# Patient Record
Sex: Male | Born: 1940 | Race: White | Hispanic: No | Marital: Married | Smoking: Current some day smoker
Health system: Southern US, Community
[De-identification: ages and names within clinical notes are randomized; demographics above are authoritative.]

## PROBLEM LIST (undated history)

## (undated) DIAGNOSIS — H833X9 Noise effects on inner ear, unspecified ear: Secondary | ICD-10-CM

## (undated) DIAGNOSIS — F0280 Dementia in other diseases classified elsewhere without behavioral disturbance: Secondary | ICD-10-CM

## (undated) DIAGNOSIS — K8020 Calculus of gallbladder without cholecystitis without obstruction: Secondary | ICD-10-CM

## (undated) DIAGNOSIS — F419 Anxiety disorder, unspecified: Secondary | ICD-10-CM

## (undated) DIAGNOSIS — K219 Gastro-esophageal reflux disease without esophagitis: Secondary | ICD-10-CM

## (undated) HISTORY — PX: APPENDECTOMY: SHX54

---
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
## 1999-10-08 ENCOUNTER — Ambulatory Visit (HOSPITAL_COMMUNITY): Admission: RE | Admit: 1999-10-08 | Discharge: 1999-10-08 | Payer: Self-pay | Admitting: Gastroenterology

## 2000-09-01 ENCOUNTER — Encounter: Payer: Self-pay | Admitting: Family Medicine

## 2000-09-01 ENCOUNTER — Encounter: Admission: RE | Admit: 2000-09-01 | Discharge: 2000-09-01 | Payer: Self-pay | Admitting: Family Medicine

## 2008-10-24 ENCOUNTER — Emergency Department (HOSPITAL_COMMUNITY): Admission: EM | Admit: 2008-10-24 | Discharge: 2008-10-24 | Payer: Self-pay | Admitting: Emergency Medicine

## 2008-12-20 ENCOUNTER — Ambulatory Visit: Payer: Self-pay | Admitting: Family Medicine

## 2009-11-20 ENCOUNTER — Encounter: Payer: Self-pay | Admitting: Family Medicine

## 2009-12-16 ENCOUNTER — Telehealth: Payer: Self-pay | Admitting: Family Medicine

## 2009-12-22 ENCOUNTER — Encounter: Payer: Self-pay | Admitting: Family Medicine

## 2010-01-06 ENCOUNTER — Ambulatory Visit: Payer: Self-pay | Admitting: Family Medicine

## 2010-01-06 DIAGNOSIS — K8020 Calculus of gallbladder without cholecystitis without obstruction: Secondary | ICD-10-CM | POA: Insufficient documentation

## 2010-01-06 HISTORY — DX: Calculus of gallbladder without cholecystitis without obstruction: K80.20

## 2010-01-06 LAB — CONVERTED CEMR LAB: PSA: 1.61 ng/mL (ref 0.10–4.00)

## 2010-01-29 ENCOUNTER — Encounter: Payer: Self-pay | Admitting: Family Medicine

## 2010-02-03 ENCOUNTER — Ambulatory Visit: Payer: Self-pay | Admitting: Family Medicine

## 2010-02-03 DIAGNOSIS — H833X9 Noise effects on inner ear, unspecified ear: Secondary | ICD-10-CM | POA: Insufficient documentation

## 2010-02-03 HISTORY — DX: Noise effects on inner ear, unspecified ear: H83.3X9

## 2010-03-23 ENCOUNTER — Encounter: Payer: Self-pay | Admitting: Family Medicine

## 2010-06-24 ENCOUNTER — Encounter: Payer: Self-pay | Admitting: Family Medicine
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
## 2010-07-14 NOTE — Procedures (Signed)
Summary: Colonoscopy Report/[facility]  Colonoscopy Report/[facility]   Imported By: Maryln Gottron 03/31/2010 15:43:27  _____________________________________________________________________  External Attachment:    Type:   Image     Comment:   External Document

## 2010-07-14 NOTE — Letter (Signed)
Summary: Multi-Ethnic Study of Atherosclerosis Results  Multi-Ethnic Study of Atherosclerosis Results   Imported By: Maryln Gottron 12/05/2009 12:44:07  _____________________________________________________________________  External Attachment:    Type:   Image     Comment:   External Document
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
## 2010-07-14 NOTE — Consult Note (Signed)
Summary: [facility]  [facility]   Imported By: Maryln Gottron 02/05/2010 12:42:28  _____________________________________________________________________  External Attachment:    Type:   Image     Comment:   External Document

## 2010-07-14 NOTE — Progress Notes (Signed)
Summary: Pt has questions re: getting complete physical done  Phone Note Call from Patient Call back at Work Phone [phone] x [phone] Call back at ext [phone]   Caller: Patient Summary of Call: Pt called and wanted to know what the next step would be in order to get a completed cpx, since info has been sent to Dr. Caryl Never from [facility].  Please advise.     Initial call taken by: Lucy Antigua,  December 16, 2009 3:43 PM  Follow-up for Phone Call        please clarify MESA? Follow-up by: Evelena Peat MD,  December 17, 2009 10:12 AM  Additional Follow-up for Phone Call Additional follow up Details #1::        MESA is Multi Ethnic Study of Atherosclerosis.  Additional Follow-up by: Lucy Antigua,  December 17, 2009 10:16 AM    Additional Follow-up for Phone Call Additional follow up Details #2::    reviewed labs from his study.  He had lipids and CBC. OK to schedule CPE and would obtain labs at office visit (have pt get AM appt fasting) WE will be sure not to duplicate labs just done. Follow-up by: Evelena Peat MD,  December 17, 2009 8:43 PM  Additional Follow-up for Phone Call Additional follow up Details #3:: Details for Additional Follow-up Action Taken: Called and lft vm for pt to call back and sch fasting labs, as noted above. Waiting for call back.   Additional Follow-up by: Lucy Antigua,  December 18, 2009 11:56 AM

## 2010-07-14 NOTE — Letter (Signed)
Summary: Multi-[facility] [place] [facility] Athersclerosis Report/[facility]  Multi-[facility] [place] of Athersclerosis Report/[facility]   Imported By: Maryln Gottron 01/12/2010 12:13:44  _____________________________________________________________________  External Attachment:    Type:   Image     Comment:   External Document

## 2010-07-22 NOTE — Letter (Signed)
Summary: [facility]   Imported By: Maryln Gottron 07/16/2010 15:06:48  _____________________________________________________________________  External Attachment:    Type:   Image     Comment:   External Document

## 2010-09-22 LAB — POCT I-STAT, CHEM 8
BUN: 16 mg/dL (ref 6–23)
Calcium, Ion: 1.17 mmol/L (ref 1.12–1.32)
Chloride: 106 mEq/L (ref 96–112)
Creatinine, Ser: 1.2 mg/dL (ref 0.4–1.5)
Glucose, Bld: 109 mg/dL — ABNORMAL HIGH (ref 70–99)
HCT: 40 % (ref 39.0–52.0)
Hemoglobin: 13.6 g/dL (ref 13.0–17.0)
Potassium: 4.2 mEq/L (ref 3.5–5.1)
Sodium: 142 mEq/L (ref 135–145)
TCO2: 26 mmol/L (ref 0–100)

## 2010-10-30 NOTE — Procedures (Signed)
. [facility]  Patient:    [name], Dennis Harper                          MRN: [id number] Proc. Date: 10/08/99 Adm. Date:  [id number] Attending:  Charna Elizabeth CC:         Nolon Nations, M.D., M.P.H.                           Procedure Report  DATE OF BIRTH:  February 22, 1941  REFERRING PHYSICIAN:  Nolon Nations, M.D., M.P.H.  PROCEDURE PERFORMED:  Colonoscopy.  ENDOSCOPIST:  Anselmo Rod, M.D.  INSTRUMENT USED:  Olympus video colonoscope.  INDICATIONS FOR PROCEDURE:  The patient is a 70 year old white male with rectal bleeding, rule out masses, polyps, hemorrhoids, etc.  PREPROCEDURE PREPARATION:  Informed consent was procured from the patient. The patient was fasted for eight hours prior to the procedure and prepped with a bottle of magnesium citrate and a gallon of NuLytely the night prior to the procedure.  PREPROCEDURE PHYSICAL:  The patient had stable vital signs.  Neck supple. Chest clear to auscultation.  S1, S2 regular.  Abdomen soft with normal abdominal bowel sounds.  DESCRIPTION OF PROCEDURE:  The patient was placed in the left lateral decubitus position and sedated with 50 mg of Demerol and 5 mg of Versed intravenously.  Once the patient was adequately sedated and maintained on low-flow oxygen and continuous cardiac monitoring, the Olympus video colonoscope was advanced from the rectum to the cecum with slight difficulty secondary to a large amount of residual stool in the colon.  There were small internal hemorrhoids seen on retroflexion.  No masses or polyps were seen. There was no evidence of diverticular disease.  The patient tolerated the procedure well without complication. The patient was changed from the left lateral decubitus position to the right lateral and supine position to facilitate adequate visualization of the colonic mucosa.  IMPRESSION: 1. Small internal hemorrhoids. 2. Large amount of residual stool in  the colon. 3. No masses or polyps seen.  Very small lesion may have been missed secondary    to residual stool in the colon.  RECOMMENDATIONS: 1. The patient has been advised to increase the fluid and fiber in his    diet. 2. Outpatient follow-up is advised in the next 10 days for further    recommendations.DD:  10/08/99 TD:  10/08/99 Job: [id number]

## 2011-06-16 DIAGNOSIS — R21 Rash and other nonspecific skin eruption: Secondary | ICD-10-CM | POA: Diagnosis not present

## 2011-06-16 DIAGNOSIS — L820 Inflamed seborrheic keratosis: Secondary | ICD-10-CM | POA: Diagnosis not present

## 2011-08-13 ENCOUNTER — Ambulatory Visit (INDEPENDENT_AMBULATORY_CARE_PROVIDER_SITE_OTHER): Payer: Medicare Other | Admitting: Family Medicine

## 2011-08-13 VITALS — BP 136/76 | HR 81 | Temp 98.6°F | Resp 16 | Ht 69.0 in | Wt 202.0 lb

## 2011-08-13 DIAGNOSIS — J069 Acute upper respiratory infection, unspecified: Secondary | ICD-10-CM

## 2011-08-13 DIAGNOSIS — R05 Cough: Secondary | ICD-10-CM

## 2011-08-13 DIAGNOSIS — R059 Cough, unspecified: Secondary | ICD-10-CM | POA: Diagnosis not present

## 2011-08-13 MED ORDER — HYDROCODONE-HOMATROPINE 5-1.5 MG/5ML PO SYRP: 5.0000 mL | ORAL_SOLUTION | Freq: Three times a day (TID) | ORAL | Status: AC | PRN

## 2011-08-13 NOTE — Progress Notes (Signed)
  Patient Name: Dennis Harper Date of Birth: [birth year] Medical Record Number: [id number] Gender: male Date of Encounter: 08/13/2011  History of Present Illness:  Dennis Harper is a 71 y.o. very pleasant male patient who presents with the following:  He has been traveling quite a bit lately. Got "a little bug" in "my lower esophagus."  Non- productive cough, keeps him up at night.  No fever or chills, no other URI symptoms.  Notes symptoms for the past 3 days.  He has tried some OTC medications but they have not helped much.   Patient Active Problem List  Diagnoses  . BENIGN POSITIONAL VERTIGO  . NOISE-INDUCED HEARING LOSS  . GALLSTONES  . PARESTHESIA   No past medical history on file. No past surgical history on file. History  Substance Use Topics  . Smoking status: Never Smoker   . Smokeless tobacco: Not on file  . Alcohol Use: No   No family history on file. No Known Allergies  Medication list has been reviewed and updated.  Review of Systems: As per HPI- otherwise negative.  Physical Examination: Filed Vitals:   08/13/11 0847  BP: 136/76  Pulse: 81  Temp: 98.6 F (37 C)  TempSrc: Oral  Resp: 16  Height: 5\' 9"  (1.753 m)  Weight: 202 lb (91.627 kg)    Body mass index is 29.83 kg/(m^2).  GEN: WDWN, NAD, Non-toxic, A & O x 3, looks well HEENT: Atraumatic, Normocephalic. Neck supple. No masses, No LAD.  Oropharynx wnl, TM wnl bilaterally Ears and Nose: No external deformity. CV: RRR, No M/G/R. No JVD. No thrill. No extra heart sounds. PULM: CTA B, no wheezes, crackles, rhonchi. No retractions. No resp. distress. No accessory muscle use. EXTR: No c/c/e NEURO Normal gait.  PSYCH: Normally interactive. Conversant. Not depressed or anxious appearing.  Calm demeanor.   Assessment and Plan: 1. Cough  HYDROcodone-homatropine (HYCODAN) 5-1.5 MG/5ML syrup   Likely viral URI- hycodan Rx as above to use for cough at night.  Also gave zpack rx to hold.   He can take the  zpack if he is still not better in a few days- let us know sooner if worse

## 2011-11-26 ENCOUNTER — Other Ambulatory Visit (HOSPITAL_COMMUNITY): Payer: Self-pay | Admitting: Radiology

## 2011-12-17 ENCOUNTER — Encounter: Payer: Self-pay | Admitting: Family Medicine

## 2012-01-05 DIAGNOSIS — R21 Rash and other nonspecific skin eruption: Secondary | ICD-10-CM | POA: Diagnosis not present

## 2012-01-07 ENCOUNTER — Encounter: Payer: Self-pay | Admitting: Family Medicine

## 2012-01-12 ENCOUNTER — Encounter: Payer: Self-pay | Admitting: Family Medicine

## 2012-01-12 ENCOUNTER — Ambulatory Visit (INDEPENDENT_AMBULATORY_CARE_PROVIDER_SITE_OTHER): Payer: Medicare Other | Admitting: Family Medicine

## 2012-01-12 VITALS — BP 130/80 | HR 72 | Temp 98.6°F | Resp 12 | Ht 70.25 in | Wt 205.0 lb

## 2012-01-12 DIAGNOSIS — Z0000 Encounter for general adult medical examination without abnormal findings: Secondary | ICD-10-CM | POA: Diagnosis not present

## 2012-01-12 DIAGNOSIS — K219 Gastro-esophageal reflux disease without esophagitis: Secondary | ICD-10-CM | POA: Diagnosis not present

## 2012-01-12 DIAGNOSIS — Z23 Encounter for immunization: Secondary | ICD-10-CM

## 2012-01-12 LAB — LIPID PANEL
Cholesterol: 190 mg/dL (ref 0–200)
HDL: 42.7 mg/dL (ref >39.00)
LDL Cholesterol: 114 mg/dL — ABNORMAL HIGH (ref 0–99)
Total CHOL/HDL Ratio: 4
Triglycerides: 167 mg/dL — ABNORMAL HIGH (ref 0.0–149.0)
VLDL: 33.4 mg/dL (ref 0.0–40.0)

## 2012-01-12 LAB — HEPATIC FUNCTION PANEL
ALT: 21 U/L (ref 0–53)
Alkaline Phosphatase: 41 U/L (ref 39–117)
Bilirubin, Direct: 0.1 mg/dL (ref 0.0–0.3)
Total Bilirubin: 0.7 mg/dL (ref 0.3–1.2)

## 2012-01-12 LAB — BASIC METABOLIC PANEL
BUN: 15 mg/dL (ref 6–23)
CO2: 29 mEq/L (ref 19–32)
Calcium: 8.9 mg/dL (ref 8.4–10.5)
Chloride: 104 mEq/L (ref 96–112)
Creatinine, Ser: 1.1 mg/dL (ref 0.4–1.5)
GFR: 67.02 mL/min (ref >60.00)
Glucose, Bld: 92 mg/dL (ref 70–99)
Potassium: 4.4 mEq/L (ref 3.5–5.1)
Sodium: 141 mEq/L (ref 135–145)

## 2012-01-12 LAB — CBC WITH DIFFERENTIAL/PLATELET
Basophils Relative: 0.5 % (ref 0.0–3.0)
Eosinophils Absolute: 0 10*3/uL (ref 0.0–0.7)
Hemoglobin: 13.7 g/dL (ref 13.0–17.0)
Lymphocytes Relative: 29.1 % (ref 12.0–46.0)
MCHC: 33.3 g/dL (ref 30.0–36.0)
Monocytes Relative: 7.4 % (ref 3.0–12.0)
Neutrophils Relative %: 62.2 % (ref 43.0–77.0)
RBC: 4.58 Mil/uL (ref 4.22–5.81)
WBC: 6 10*3/uL (ref 4.5–10.5)

## 2012-01-12 NOTE — Patient Instructions (Addendum)
Work on losing some weight.

## 2012-01-12 NOTE — Progress Notes (Signed)
Subjective:    Patient ID: Dennis Harper, male    DOB: 06-20-1940, 71 y.o.   MRN: [id number]  HPI  Patient seen for medical followup and Medicare wellness exam. He has history of chronic hearing loss with hearing aids. Has history of chronic GERD which is controlled with omeprazole. He has tried several times stopping this but has had breakthrough symptoms. No appetite changes. Some recent mild weight gain. No consistent exercise. No dysphagia.  Last tetanus over 10 years ago. No history of shingles vaccine. Colonoscopy 2011 which was normal.  Past Medical History  Diagnosis Date  . Migraine   . GALLSTONES 01/06/2010    Qualifier: Diagnosis of  By: Caryl Never MD, Bruce    . 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 Appendectomy     reports that he has never smoked. He does not have any smokeless tobacco history on file. He reports that he does not drink alcohol or use illicit drugs. family history is negative for Alcohol abuse and Heart disease. No Known Allergies  1.  Risk factors based on Past Medical , Social, and Family history reviewed as above 2.  Limitations in physical activities low risk for fall 3.  Depression/mood no recent depression or anxiety issues 4.  Hearing chronic hearing loss and wears bilateral hearing aids 5.  ADLs fully independent in all 6.  Cognitive function (orientation to time and place, language, writing, speech,memory) no issues with a shorter long-term every deficit. Language and judgment intact 7.  Home Safety no issues identified 8.  Height, weight, and visual acuity. Mild weight gain. Vision stable. 9.  Counseling discussed need for weight loss and more consistent exercise 10. Recommendation of preventive services. Needs tetanus booster and shingles vaccine. We'll check on coverage 11. Labs based on risk factors lipid, hepatic, BMP, CBC 12. Care Plan as  above.    Review of Systems  Constitutional: Negative for fever, activity change, appetite change, fatigue and unexpected weight change.  HENT: Negative for ear pain, congestion and trouble swallowing.   Eyes: Negative for pain and visual disturbance.  Respiratory: Negative for cough, shortness of breath and wheezing.   Cardiovascular: Negative for chest pain and palpitations.  Gastrointestinal: Negative for nausea, vomiting, abdominal pain, diarrhea, constipation, blood in stool, abdominal distention and rectal pain.  Genitourinary: Negative for dysuria, hematuria and testicular pain.  Musculoskeletal: Negative for joint swelling and arthralgias.  Skin: Negative for rash.  Neurological: Negative for dizziness, syncope and headaches.  Hematological: Negative for adenopathy.  Psychiatric/Behavioral: Negative for confusion and dysphoric mood.       Objective:   Physical Exam  Constitutional: He is oriented to person, place, and time. He appears well-developed and well-nourished. No distress.  HENT:  Head: Normocephalic and atraumatic.  Right Ear: External ear normal.  Left Ear: External ear normal.  Mouth/Throat: Oropharynx is clear and moist.  Eyes: Conjunctivae and EOM are normal. Pupils are equal, round, and reactive to light.  Neck: Normal range of motion. Neck supple. No thyromegaly present.  Cardiovascular: Normal rate, regular rhythm and normal heart sounds.   No murmur heard. Pulmonary/Chest: No respiratory distress. He has no wheezes. He has no rales.  Abdominal: Soft. Bowel sounds are normal. He exhibits no distension and no mass. There is no tenderness. There is no rebound and no guarding.  Musculoskeletal: He exhibits no edema.  Lymphadenopathy:  He has no cervical adenopathy.  Neurological: He is alert and oriented to person, place, and time. He displays normal reflexes. No cranial nerve deficit.  Skin: No rash noted.  Psychiatric: He has a normal mood and affect.            Assessment & Plan:  Patient seen for wellness visit. Tetanus booster be given. Check on coverage for shingles vaccine. Long history of GERD. Well controlled with omeprazole.

## 2012-01-13 NOTE — Progress Notes (Signed)
Quick Note:  Pt wife informed ______ 

## 2012-01-19 DIAGNOSIS — H40059 Ocular hypertension, unspecified eye: Secondary | ICD-10-CM | POA: Diagnosis not present

## 2012-01-19 DIAGNOSIS — H353190 Nonexudative age-related macular degeneration, unspecified eye, stage unspecified: Secondary | ICD-10-CM | POA: Diagnosis not present

## 2012-04-25 DIAGNOSIS — Z23 Encounter for immunization: Secondary | ICD-10-CM | POA: Diagnosis not present

## 2012-05-30 DIAGNOSIS — B356 Tinea cruris: Secondary | ICD-10-CM | POA: Diagnosis not present

## 2012-10-13 ENCOUNTER — Ambulatory Visit (INDEPENDENT_AMBULATORY_CARE_PROVIDER_SITE_OTHER): Payer: Medicare Other | Admitting: Emergency Medicine

## 2012-10-13 VITALS — BP 120/76 | HR 79 | Temp 98.0°F | Resp 16 | Ht 70.0 in | Wt 199.8 lb

## 2012-10-13 DIAGNOSIS — W57XXXA Bitten or stung by nonvenomous insect and other nonvenomous arthropods, initial encounter: Secondary | ICD-10-CM | POA: Diagnosis not present

## 2012-10-13 MED ORDER — DOXYCYCLINE HYCLATE 100 MG PO CAPS: 200.0000 mg | ORAL_CAPSULE | Freq: Once | ORAL | Status: DC

## 2012-10-13 NOTE — Patient Instructions (Addendum)
Deer Tick Bite Deer ticks are brown arachnids (spider family) that vary in size from as small as the head of a pin to 1/4 inch (1/2 cm) diameter. They thrive in wooded areas. Deer are the preferred host of adult deer ticks. Small rodents are the host of young ticks (nymphs). When a person walks in a field or wooded area, young and adult ticks in the surrounding grass and vegetation can attach themselves to the skin. They can suck blood for hours to days if unnoticed. Ticks are found all over the [place] Some ticks carry a specific bacteria (Borrelia burgdorferi) that causes an infection called Lyme disease. The bacteria is typically passed into a person during the blood sucking process. This happens after the tick has been attached for at least a number of hours. While ticks can be found all over the [place] those carrying the bacteria that causes Lyme disease are most common in [place] and the [place]. Only a small proportion of ticks in these areas carry the Lyme disease bacteria and cause human infections. Ticks usually attach to warm spots on the body, such as the:  Head.  Back.  Neck.  Armpits.  Groin. SYMPTOMS  Most of the time, a deer tick bite will not be felt. You may or may not see the attached tick. You may notice mild irritation or redness around the bite site. If the deer tick passes the Lyme disease bacteria to a person, a round, red rash may be noticed 2 to 3 days after the bite. The rash may be clear in the middle, like a bull's-eye or target. If not treated, other symptoms may develop several days to weeks after the onset of the rash. These symptoms may include:  New rash lesions.  Fatigue and weakness.  General ill feeling and achiness.  Chills.  Headache and neck pain.  Swollen lymph glands.  Sore muscles and joints. 5 to 15% of untreated people with Lyme disease may develop more severe illnesses after several weeks to months. This may include inflammation of the  brain lining (meningitis), nerve palsies, an abnormal heartbeat, or severe muscle and joint pain and inflammation (myositis or arthritis). DIAGNOSIS   Physical exam and medical history.  Viewing the tick if it was saved for confirmation.  Blood tests (to check or confirm the presence of Lyme disease). TREATMENT  Most ticks do not carry disease. If found, an attached tick should be removed using tweezers. Tweezers should be placed under the body of the tick so it is removed by its attachment parts (pincers). If there are signs or symptoms of being sick, or Lyme disease is confirmed, medicines (antibiotics) that kill germs are usually prescribed. In more severe cases, antibiotics may be given through an intravenous (IV) access. HOME CARE INSTRUCTIONS   Always remove ticks with tweezers. Do not use petroleum jelly or other methods to kill or remove the tick. Slide the tweezers under the body and pull out as much as you can. If you are not sure what it is, save it in a jar and show your caregiver.  Once you remove the tick, the skin will heal on its own. Wash your hands and the affected area with water and soap. You may place a bandage on the affected area.  Take medicine as directed. You may be advised to take a full course of antibiotics.  Follow up with your caregiver as recommended. FINDING OUT THE RESULTS OF YOUR TEST Not all test results are available   be advised to take a full course of antibiotics.   Follow up with your caregiver as recommended.  FINDING OUT THE RESULTS OF YOUR TEST  Not all test results are available during your visit. If your test results are not back during the visit, make an appointment with your caregiver to find out the results. Do not assume everything is normal if you have not heard from your caregiver or the medical facility. It is important for you to follow up on all of your test results.  PROGNOSIS   If Lyme disease is confirmed, early treatment with antibiotics is very effective. Following preventive guidelines is important since it is possible to get the disease more than once.  PREVENTION    Wear long sleeves and long pants in  wooded or grassy areas. Tuck your pants into your socks.   Use an insect repellent while hiking.   Check yourself, your children, and your pets regularly for ticks after playing outside.   Clear piles of leaves or brush from your yard. Ticks might live there.  SEEK MEDICAL CARE IF:    You or your child has an oral temperature above 102 F (38.9 C).   You develop a severe headache following the bite.   You feel generally ill.   You notice a rash.   You are having trouble removing the tick.   The bite area has red skin or yellow drainage.  SEEK IMMEDIATE MEDICAL CARE IF:    Your face is weak and droopy or you have other neurological symptoms.   You have severe joint pain or weakness.  MAKE SURE YOU:    Understand these instructions.   Will watch your condition.   Will get help right away if you are not doing well or get worse.  FOR MORE INFORMATION  Centers for Disease Control and Prevention: www.cdc.gov  American Academy of Family Physicians: www.aafp.org  Document Released: 08/25/2009 Document Revised: 08/23/2011 Document Reviewed: 08/25/2009  [employer] Patient Information 2013 [employer].

## 2012-10-13 NOTE — Progress Notes (Signed)
[facility] [address] [place] [address] [phone]  Date:  10/13/2012   Name:  Dennis Harper   DOB:  July 11, 1940   MRN:  [id number]  PCP:  Kristian Covey, MD    Chief Complaint: Tick Removal   History of Present Illness:  Dennis Harper is a 72 y.o. very pleasant male patient who presents with the following:  Found a tick on his right flank.  Removed by a friend.  Not symptomatic.  No improvement with over the counter medications or other home remedies. Denies other complaint or health concern today.   Patient Active Problem List   Diagnosis Date Noted  . GERD (gastroesophageal reflux disease) 01/12/2012  . 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 GALLSTONES 01/06/2010  . BENIGN POSITIONAL VERTIGO 12/20/2008  . PARESTHESIA 12/20/2008    Past Medical History  Diagnosis Date  . Migraine   . GALLSTONES 01/06/2010    Qualifier: Diagnosis of  By: Caryl Never MD, Bruce    . 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 Appendectomy      History  Substance Use Topics  . Smoking status: Former Games developer  . Smokeless tobacco: Not on file  . Alcohol Use: No    Family History  Problem Relation Age of Onset  . Alcohol abuse Neg Hx     family hx  . Heart disease Neg Hx     family hx    No Known Allergies  Medication list has been reviewed and updated.  Current Outpatient Prescriptions on File Prior to Visit  Medication Sig Dispense Refill  . omeprazole (PRILOSEC OTC) 20 MG tablet Take 20 mg by mouth daily.       No current facility-administered medications on file prior to visit.    Review of Systems:  As per HPI, otherwise negative.    Physical Examination: Filed Vitals:   10/13/12 0846  BP: 120/76  Pulse: 79  Temp: 98 F (36.7 C)  Resp: 16   Filed Vitals:   10/13/12 0846  Height: 5\' 10"  (1.778 m)  Weight: 199 lb 12.8 oz  (90.629 kg)   Body mass index is 28.67 kg/(m^2). Ideal Body Weight: Weight in (lb) to have BMI = 25: 173.9   GEN: WDWN, NAD, Non-toxic, Alert & Oriented x 3 HEENT: Atraumatic, Normocephalic.  Ears and Nose: No external deformity. EXTR: No clubbing/cyanosis/edema NEURO: Normal gait.  PSYCH: Normally interactive. Conversant. Not depressed or anxious appearing.  Calm demeanor.  Bite wound on right CVA  Assessment and Plan: Tick bite Doxy   Signed,  Phillips Odor, MD

## 2013-07-25 DIAGNOSIS — M5137 Other intervertebral disc degeneration, lumbosacral region: Secondary | ICD-10-CM | POA: Diagnosis not present

## 2013-07-25 DIAGNOSIS — M999 Biomechanical lesion, unspecified: Secondary | ICD-10-CM | POA: Diagnosis not present

## 2013-07-26 ENCOUNTER — Telehealth: Payer: Self-pay | Admitting: Family Medicine

## 2013-07-26 DIAGNOSIS — M5137 Other intervertebral disc degeneration, lumbosacral region: Secondary | ICD-10-CM | POA: Diagnosis not present

## 2013-07-26 DIAGNOSIS — M999 Biomechanical lesion, unspecified: Secondary | ICD-10-CM | POA: Diagnosis not present

## 2013-07-26 NOTE — Telephone Encounter (Signed)
Set up appointment °

## 2013-07-26 NOTE — Telephone Encounter (Signed)
Pt is not at home. Will try back later. No VM

## 2013-07-26 NOTE — Telephone Encounter (Signed)
Can you please call pt for appointment for tomorrow follow up. Thank you

## 2013-07-26 NOTE — Telephone Encounter (Signed)
Dr. Deatra Robinson calling to inform he saw this patient in his office today due to injury.  Pt states he also has experienced loss of urinary control as a result of the injury.  Dr. Deatra Robinson thinks the patient may need an MRI, however he has asked the patient to contact our office to schedule an appt with PCP.  Dr. Deatra Robinson will send over office visits note for PCP to review.

## 2013-07-27 NOTE — Telephone Encounter (Signed)
Pt request Mon appt, done!

## 2013-07-30 ENCOUNTER — Encounter: Payer: Self-pay | Admitting: Family Medicine

## 2013-07-30 ENCOUNTER — Ambulatory Visit (INDEPENDENT_AMBULATORY_CARE_PROVIDER_SITE_OTHER): Payer: Medicare Other | Admitting: Family Medicine

## 2013-07-30 VITALS — BP 132/72 | HR 74 | Temp 98.4°F | Wt 197.0 lb

## 2013-07-30 DIAGNOSIS — M545 Low back pain, unspecified: Secondary | ICD-10-CM | POA: Diagnosis not present

## 2013-07-30 NOTE — Progress Notes (Signed)
   Subjective:    Patient ID: Dennis Harper, male    DOB: 29-Nov-1940, 73 y.o.   MRN: [id number]  HPI  Seen with episode about 2 weeks ago of 2 episodes of urine or stool incontinence. Saw his chiropractor who ordered plain x-rays and suggested possible MRI scan. The patient at that time had low back pain. He denied any radiculopathy pain symptoms. No weakness. No numbness. No perianal anesthesia. Patient's had no episodes whatsoever since then. He has no low back pain at this time. He has not any previous back surgeries.  Past Medical History  Diagnosis Date  . Migraine   . GALLSTONES 01/06/2010    Qualifier: Diagnosis of  By: Elease Hashimoto MD, Jeorgia Helming    . 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 Appendectomy      reports that he has quit smoking. He does not have any smokeless tobacco history on file. He reports that he does not drink alcohol or use illicit drugs. family history is negative for Alcohol abuse and Heart disease. No Known Allergies    Review of Systems  Constitutional: Negative for fever, chills, appetite change and unexpected weight change.  Endocrine: Negative for polydipsia and polyuria.  Genitourinary: Negative for dysuria and hematuria.  Musculoskeletal: Negative for back pain.  Neurological: Negative for weakness and numbness.       Objective:   Physical Exam  Constitutional: He appears well-developed and well-nourished.  Cardiovascular: Normal rate.   Pulmonary/Chest: Effort normal and breath sounds normal. No respiratory distress. He has no wheezes. He has no rales.  Musculoskeletal:  Straight leg raises are negative bilaterally  Neurological:  Full-strength lower extremities. Symmetric reflexes. Normal sensory function to touch          Assessment & Plan:  Patient presents with recent ? transient urine and stool incontinence but no episodes over  the past couple weeks. He has nonfocal neurologic exam. At this point would recommend observation. MRI promptly if he has any recurrent episodes or new symptoms such as numbness or weakness

## 2013-07-30 NOTE — Patient Instructions (Signed)
Follow up promptly for any recurrent urine or stool incontinence. Also, follow up for any lower extremity numbness or weakness.

## 2013-07-30 NOTE — Progress Notes (Signed)
Pre visit review using our clinic review tool, if applicable. No additional management support is needed unless otherwise documented below in the visit note. 

## 2013-08-01 DIAGNOSIS — M5137 Other intervertebral disc degeneration, lumbosacral region: Secondary | ICD-10-CM | POA: Diagnosis not present

## 2013-08-01 DIAGNOSIS — M999 Biomechanical lesion, unspecified: Secondary | ICD-10-CM | POA: Diagnosis not present

## 2013-08-07 DIAGNOSIS — M5137 Other intervertebral disc degeneration, lumbosacral region: Secondary | ICD-10-CM | POA: Diagnosis not present

## 2013-08-07 DIAGNOSIS — M999 Biomechanical lesion, unspecified: Secondary | ICD-10-CM | POA: Diagnosis not present

## 2013-08-08 DIAGNOSIS — M999 Biomechanical lesion, unspecified: Secondary | ICD-10-CM | POA: Diagnosis not present

## 2013-08-08 DIAGNOSIS — M5137 Other intervertebral disc degeneration, lumbosacral region: Secondary | ICD-10-CM | POA: Diagnosis not present

## 2013-08-20 DIAGNOSIS — M5137 Other intervertebral disc degeneration, lumbosacral region: Secondary | ICD-10-CM | POA: Diagnosis not present

## 2013-08-20 DIAGNOSIS — M999 Biomechanical lesion, unspecified: Secondary | ICD-10-CM | POA: Diagnosis not present

## 2013-08-21 DIAGNOSIS — M5137 Other intervertebral disc degeneration, lumbosacral region: Secondary | ICD-10-CM | POA: Diagnosis not present

## 2013-08-21 DIAGNOSIS — M999 Biomechanical lesion, unspecified: Secondary | ICD-10-CM | POA: Diagnosis not present

## 2013-09-04 DIAGNOSIS — H353190 Nonexudative age-related macular degeneration, unspecified eye, stage unspecified: Secondary | ICD-10-CM | POA: Diagnosis not present

## 2013-10-10 DIAGNOSIS — M25519 Pain in unspecified shoulder: Secondary | ICD-10-CM | POA: Diagnosis not present

## 2013-10-30 DIAGNOSIS — M999 Biomechanical lesion, unspecified: Secondary | ICD-10-CM | POA: Diagnosis not present

## 2013-10-30 DIAGNOSIS — M5137 Other intervertebral disc degeneration, lumbosacral region: Secondary | ICD-10-CM | POA: Diagnosis not present

## 2013-11-06 DIAGNOSIS — M48061 Spinal stenosis, lumbar region without neurogenic claudication: Secondary | ICD-10-CM | POA: Diagnosis not present

## 2013-11-06 DIAGNOSIS — M79609 Pain in unspecified limb: Secondary | ICD-10-CM | POA: Diagnosis not present

## 2013-11-07 DIAGNOSIS — M545 Low back pain, unspecified: Secondary | ICD-10-CM | POA: Diagnosis not present

## 2013-11-23 DIAGNOSIS — M48061 Spinal stenosis, lumbar region without neurogenic claudication: Secondary | ICD-10-CM | POA: Diagnosis not present

## 2013-11-23 DIAGNOSIS — M5137 Other intervertebral disc degeneration, lumbosacral region: Secondary | ICD-10-CM | POA: Diagnosis not present

## 2013-11-23 DIAGNOSIS — M79609 Pain in unspecified limb: Secondary | ICD-10-CM | POA: Diagnosis not present

## 2013-11-26 ENCOUNTER — Encounter: Payer: Self-pay | Admitting: Family Medicine

## 2013-11-29 DIAGNOSIS — M48061 Spinal stenosis, lumbar region without neurogenic claudication: Secondary | ICD-10-CM | POA: Diagnosis not present

## 2013-12-06 DIAGNOSIS — M48061 Spinal stenosis, lumbar region without neurogenic claudication: Secondary | ICD-10-CM | POA: Diagnosis not present

## 2013-12-12 DIAGNOSIS — M545 Low back pain, unspecified: Secondary | ICD-10-CM | POA: Diagnosis not present

## 2014-05-02 DIAGNOSIS — H3530 Unspecified macular degeneration: Secondary | ICD-10-CM | POA: Diagnosis not present

## 2014-05-02 DIAGNOSIS — H40013 Open angle with borderline findings, low risk, bilateral: Secondary | ICD-10-CM | POA: Diagnosis not present

## 2014-08-12 DIAGNOSIS — L2084 Intrinsic (allergic) eczema: Secondary | ICD-10-CM | POA: Diagnosis not present

## 2014-08-12 DIAGNOSIS — B079 Viral wart, unspecified: Secondary | ICD-10-CM | POA: Diagnosis not present

## 2014-08-23 ENCOUNTER — Encounter: Payer: Self-pay | Admitting: Family Medicine

## 2014-08-23 ENCOUNTER — Ambulatory Visit (INDEPENDENT_AMBULATORY_CARE_PROVIDER_SITE_OTHER): Payer: Medicare Other | Admitting: Family Medicine

## 2014-08-23 VITALS — BP 120/70 | HR 78 | Temp 98.6°F | Wt 176.0 lb

## 2014-08-23 DIAGNOSIS — K4090 Unilateral inguinal hernia, without obstruction or gangrene, not specified as recurrent: Secondary | ICD-10-CM | POA: Diagnosis not present

## 2014-08-23 DIAGNOSIS — R21 Rash and other nonspecific skin eruption: Secondary | ICD-10-CM

## 2014-08-23 DIAGNOSIS — G3184 Mild cognitive impairment, so stated: Secondary | ICD-10-CM

## 2014-08-23 DIAGNOSIS — R413 Other amnesia: Secondary | ICD-10-CM

## 2014-08-23 LAB — TSH: TSH: 1.79 u[IU]/mL (ref 0.35–4.50)

## 2014-08-23 LAB — VITAMIN B12: Vitamin B-12: 833 pg/mL (ref 211–911)

## 2014-08-23 NOTE — Progress Notes (Signed)
   Subjective:    Patient ID: Dennis Harper, male    DOB: 08/11/1940, 74 y.o.   MRN: [id number]  HPI Patient seen today for the following concerns  Right inguinal swelling. He first noticed about 4 years ago but recently became more prominent. About 3 weeks ago he was straining to lift noticed some slight pain but none whatsoever since then. He has not seen any skin changes. He is not sure he wishes to pursue surgery at this point  Concern for possible short-term memory loss. His wife  recently noticed him repeating himself occasionally. He still works part-time. Generally can remember day of week and date without difficulty. He's not had any headaches. No focal neurologic symptoms.  Pruritic dry slightly scaly rash left medial leg. He has triamcinolone ointment which is helped somewhat. He has not been applying this until recently.  Past Medical History  Diagnosis Date  . Migraine   . GALLSTONES 01/06/2010    Qualifier: Diagnosis of  By: Elease Hashimoto MD, Bruce    . 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 Appendectomy      reports that he has quit smoking. He does not have any smokeless tobacco history on file. He reports that he does not drink alcohol or use illicit drugs. family history is negative for Alcohol abuse and Heart disease. No Known Allergies    Review of Systems  Constitutional: Negative for fever, chills, appetite change and unexpected weight change.  Respiratory: Negative for cough and shortness of breath.   Cardiovascular: Negative for chest pain.  Gastrointestinal: Negative for nausea, vomiting and abdominal pain.  Skin: Positive for rash.  Neurological: Negative for seizures and headaches.  Psychiatric/Behavioral: Negative for confusion, dysphoric mood and agitation.       Objective:   Physical Exam  Constitutional: He is oriented to person, place, and  time. He appears well-developed and well-nourished. No distress.  Neck: Neck supple. No thyromegaly present.  Cardiovascular: Normal rate and regular rhythm.   Pulmonary/Chest: Effort normal and breath sounds normal. No respiratory distress. He has no wheezes. He has no rales.  Genitourinary:  Large right inguinal hernia. Soft and nontender.  Musculoskeletal: He exhibits no edema.  Neurological: He is alert and oriented to person, place, and time. No cranial nerve deficit. Coordination normal.  Gait normal. No focal strength deficits.  Skin: Rash noted.  Left medial leg reveals nonspecific area of mild erythema and scaling approximately 3 x 4 cm. Nontender. No pustules. No vesicles.  Psychiatric:  Mini-Mental status exam 26 out of 30. No prior baseline for comparison          Assessment & Plan:  #1 large right inguinal hernia. Mostly asymptomatic. We discussed surgical referral and patient wishes to observe at this time. Follow-up immediately for any signs or symptoms of strangulation #2 eczematous rash left leg. Continue triamcinolone ointment as needed  #3 cognitive impairment. Check B12 and TSH. If normal, repeat MMSE in 6 months. We discussed possible medications such as Namenda or Aricept and at this point he wishes to wait

## 2014-08-23 NOTE — Patient Instructions (Addendum)
Inguinal Hernia, Adult Muscles help keep everything in the body in its proper place. But if a weak spot in the muscles develops, something can poke through. That is called a hernia. When this happens in the lower part of the belly (abdomen), it is called an inguinal hernia. (It takes its name from a part of the body in this region called the inguinal canal.) A weak spot in the wall of muscles lets some fat or part of the small intestine bulge through. An inguinal hernia can develop at any age. Men get them more often than women. CAUSES  In adults, an inguinal hernia develops over time.  It can be triggered by:  Suddenly straining the muscles of the lower abdomen.  Lifting heavy objects.  Straining to have a bowel movement. Difficult bowel movements (constipation) can lead to this.  Constant coughing. This may be caused by smoking or lung disease.  Being overweight.  Being pregnant.  Working at a job that requires long periods of standing or heavy lifting.  Having had an inguinal hernia before. One type can be an emergency situation. It is called a strangulated inguinal hernia. It develops if part of the small intestine slips through the weak spot and cannot get back into the abdomen. The blood supply can be cut off. If that happens, part of the intestine may die. This situation requires emergency surgery. SYMPTOMS  Often, a small inguinal hernia has no symptoms. It is found when a healthcare provider does a physical exam. Larger hernias usually have symptoms.   In adults, symptoms may include:  A lump in the groin. This is easier to see when the person is standing. It might disappear when lying down.  In men, a lump in the scrotum.  Pain or burning in the groin. This occurs especially when lifting, straining or coughing.  A dull ache or feeling of pressure in the groin.  Signs of a strangulated hernia can include:  A bulge in the groin that becomes very painful and tender to the  touch.  A bulge that turns red or purple.  Fever, nausea and vomiting.  Inability to have a bowel movement or to pass gas. DIAGNOSIS  To decide if you have an inguinal hernia, a healthcare provider will probably do a physical examination.  This will include asking questions about any symptoms you have noticed.  The healthcare provider might feel the groin area and ask you to cough. If an inguinal hernia is felt, the healthcare provider may try to slide it back into the abdomen.  Usually no other tests are needed. TREATMENT  Treatments can vary. The size of the hernia makes a difference. Options include:  Watchful waiting. This is often suggested if the hernia is small and you have had no symptoms.  No medical procedure will be done unless symptoms develop.  You will need to watch closely for symptoms. If any occur, contact your healthcare provider right away.  Surgery. This is used if the hernia is larger or you have symptoms.  Open surgery. This is usually an outpatient procedure (you will not stay overnight in a hospital). An cut (incision) is made through the skin in the groin. The hernia is put back inside the abdomen. The weak area in the muscles is then repaired by herniorrhaphy or hernioplasty. Herniorrhaphy: in this type of surgery, the weak muscles are sewn back together. Hernioplasty: a patch or mesh is used to close the weak area in the abdominal wall.  Laparoscopy.   In this procedure, a surgeon makes small incisions. A thin tube with a tiny video camera (called a laparoscope) is put into the abdomen. The surgeon repairs the hernia with mesh by looking with the video camera and using two long instruments. HOME CARE INSTRUCTIONS   After surgery to repair an inguinal hernia:  You will need to take pain medicine prescribed by your healthcare provider. Follow all directions carefully.  You will need to take care of the wound from the incision.  Your activity will be  restricted for awhile. This will probably include no heavy lifting for several weeks. You also should not do anything too active for a few weeks. When you can return to work will depend on the type of job that you have.  During "watchful waiting" periods, you should:  Maintain a healthy weight.  Eat a diet high in fiber (fruits, vegetables and whole grains).  Drink plenty of fluids to avoid constipation. This means drinking enough water and other liquids to keep your urine clear or pale yellow.  Do not lift heavy objects.  Do not stand for long periods of time.  Quit smoking. This should keep you from developing a frequent cough. SEEK MEDICAL CARE IF:   A bulge develops in your groin area.  You feel pain, a burning sensation or pressure in the groin. This might be worse if you are lifting or straining.  You develop a fever of more than 100.5 F (38.1 C). SEEK IMMEDIATE MEDICAL CARE IF:   Pain in the groin increases suddenly.  A bulge in the groin gets bigger suddenly and does not go down.  For men, there is sudden pain in the scrotum. Or, the size of the scrotum increases.  A bulge in the groin area becomes red or purple and is painful to touch.  You have nausea or vomiting that does not go away.  You feel your heart beating much faster than normal.  You cannot have a bowel movement or pass gas.  You develop a fever of more than 102.0 F (38.9 C). Document Released: 10/17/2008 Document Revised: 08/23/2011 Document Reviewed: 10/17/2008 [facility] Patient Information 2015 [place], [place]. This information is not intended to replace advice given to you by your health care provider. Make sure you discuss any questions you have with your health care provider. Mild Neurocognitive Disorder Mild neurocognitive disorder (formerly known as mild cognitive impairment) is a mental disorder. It is a slight abnormal decrease in mental function. The areas of mental function affected may  include memory, thought, communication, behavior, and completion of tasks. The decrease is noticeable and measurable but for the most part does not interfere with your daily activities. Mild neurocognitive disorder typically occurs in people older than 60 years but can occur earlier. It is not as serious as major neurocognitive disorder (formerly known as dementia) but may lead to a more serious neurocognitive disorder. However, in some cases the condition does not get worse. A few people with this disorder even improve. CAUSES  There are a number of different causes of mild neurocognitive disorder:   Brain disorders associated with abnormal protein deposits, such as Alzheimer's disease, Pick's disease, and Lewy body disease.  Brain disorders associated with abnormal movement, such as Parkinson's disease and Huntington's disease.  Diseases affecting blood vessels in the brain and resulting in mini-strokes.  Certain infections, such as human immunodeficiency virus (HIV) infection.  Traumatic brain injury.  Other medical conditions such as brain tumors, underactive thyroid (hypothyroidism), and vitamin  B12 deficiency.  Use of certain prescription medicine and "recreational" drugs. SYMPTOMS  Symptoms of mild neurocognitive disorder include:  Difficulty remembering. You may forget details of recent events, names, or phone numbers. You may forget important social events and appointments or repeatedly forget where you put your car keys.  Difficulty thinking and solving problems. You may have trouble with complex tasks such as paying bills or driving in unfamiliar locations.  Difficulty communicating. You may have trouble finding the right word, naming an object, forming a sentence that makes sense, or understanding what you read or hear.  Changes in your behavior or personality. You may lose interest in the things that you used to enjoy or withdraw from social situations. You may get angry  more easily than usual. You may act before thinking. You may do things in public that you would not usually do. You may hear or see things that are not real (hallucinations). You may believe falsely that others are trying to hurt you (paranoia). DIAGNOSIS Mild neurocognitive disorder is diagnosed through an assessment by your health care provider. Your health care provider will ask you and your family, friends, or coworkers questions about your symptoms. He or she will ask how often the symptoms occur, how long they have been occurring, whether they are getting worse, and the effect they are having on your life. Your health care provider may refer you to a neurologist or mental health specialist for a detailed evaluation of your mental functions (neuropsychological testing).  To identify the cause of your mild neurocognitive disorder, your health care provider may:  Obtain a detailed medical history.  Ask about alcohol and drug use, including prescription medicine.  Perform a physical exam.  Order blood tests and brain imaging exams. TREATMENT  Mild neurocognitive disorder caused by infections, use of certain medicines or "recreational" drugs, and certain medical conditions may improve with treatment of the condition that is causing the disorder. Mild neurocognitive disorder resulting from other causes generally does not improve and may worsen. In these cases, the goal of treatment is to slow progression of the disorder and help you cope with the loss of mental function. Treatments in these cases include:   Medicine. Medicine helps mainly with memory loss and behavioral symptoms.   Talk therapy. Talk therapy provides education, emotional support, memory aids, and other ways of making up for decreases in mental function.   Lifestyle changes. These include regular exercise, a healthy diet (including essential omega-3 fatty acids), intellectual stimulation, and increased social  interaction. Document Released: 01/31/2013 Document Revised: 10/15/2013 Document Reviewed: 01/31/2013 [facility] Patient Information 2015 [place], [place]. This information is not intended to replace advice given to you by your health care provider. Make sure you discuss any questions you have with your health care provider.

## 2014-08-23 NOTE — Progress Notes (Signed)
Pre visit review using our clinic review tool, if applicable. No additional management support is needed unless otherwise documented below in the visit note. 

## 2014-08-26 ENCOUNTER — Telehealth: Payer: Self-pay

## 2014-08-26 NOTE — Telephone Encounter (Signed)
Spoke to patient's wife about neuro referral concerns. Patient's wife states she would like a referral. Made patient's wife aware of MD's notes of normal labs and plan to follow-up 6 months later. Patient's wife continued to state concerns. Advised we will revisit this topic with MD Burchette and follow back up with patient and wife tomorrow.

## 2014-08-26 NOTE — Telephone Encounter (Signed)
I suggest that we not wait 6 months to reassess and have pt AND his wife (if pt is willing) return for discussion of further interventions in next 1-2 months.

## 2014-08-27 ENCOUNTER — Telehealth: Payer: Self-pay

## 2014-08-27 NOTE — Telephone Encounter (Signed)
Lm on vm for pt.to cb °

## 2014-08-27 NOTE — Telephone Encounter (Signed)
Called to give patient his lab results. Pt wife picked up the phone when I asked to speak to Dennis Harper pt wife stated that no one by that name leaves here and that the patient goes by Dennis Harper. Pt wife also asked me what did I want with her husband. Informed the wife that i was calling from Dr. Elease Hashimoto office to give the patient his results. Pt wife then asked for me to give her the results, I checked to see if patient wife was on the [facility] and she is listed on his DPR. Gave patient wife the results. Pt wife then insisted that I put in a referral for the patient to go see a neurologist, I informed patient wife that i would have to get approval from Dr. Elease Hashimoto before i could put in a referral. Pt wife said that she would like to speak to Dr. Elease Hashimoto, i informed the wife that Dr. B was in a room with a patient, pt wife then says you need to get him out of that room with the patient. I informed the patient wife that i could not do that because he was in a room with a patient, and that i would give Dr. Elease Hashimoto the message and have him give her a call when he gets out of the room. Pt wife stated that was not good enough. I told the pt wife to hold on and i went and got [place].

## 2014-08-27 NOTE — Telephone Encounter (Signed)
Can you please call patient and schedule a visit.

## 2014-08-29 ENCOUNTER — Telehealth: Payer: Self-pay | Admitting: Family Medicine

## 2014-08-29 NOTE — Telephone Encounter (Signed)
Noted  

## 2014-08-29 NOTE — Telephone Encounter (Signed)
Can please schedule appt.

## 2014-08-29 NOTE — Telephone Encounter (Signed)
Pt would like a referral to a neurologist for his short term memory loss.

## 2014-08-29 NOTE — Telephone Encounter (Signed)
Wife states pt will not be available until after 2 pm Friday and she cannot schedule his appts.  Will cb then to sche pt.

## 2014-09-02 NOTE — Telephone Encounter (Signed)
appt has been scheduled for 09/16/14

## 2014-09-03 DIAGNOSIS — H3530 Unspecified macular degeneration: Secondary | ICD-10-CM | POA: Diagnosis not present

## 2014-09-10 NOTE — Telephone Encounter (Signed)
Pt has been scheduled.  °

## 2014-09-16 ENCOUNTER — Ambulatory Visit (INDEPENDENT_AMBULATORY_CARE_PROVIDER_SITE_OTHER): Payer: Medicare Other | Admitting: Family Medicine

## 2014-09-16 ENCOUNTER — Encounter: Payer: Self-pay | Admitting: Family Medicine

## 2014-09-16 VITALS — BP 120/68 | HR 76 | Temp 98.4°F | Wt 176.0 lb

## 2014-09-16 DIAGNOSIS — G309 Alzheimer's disease, unspecified: Secondary | ICD-10-CM | POA: Insufficient documentation

## 2014-09-16 DIAGNOSIS — F0280 Dementia in other diseases classified elsewhere without behavioral disturbance: Secondary | ICD-10-CM | POA: Insufficient documentation

## 2014-09-16 DIAGNOSIS — R4189 Other symptoms and signs involving cognitive functions and awareness: Secondary | ICD-10-CM | POA: Diagnosis not present

## 2014-09-16 MED ORDER — MEMANTINE HCL 28 X 5 MG & 21 X 10 MG PO TABS: ORAL_TABLET | ORAL | Status: DC

## 2014-09-16 NOTE — Patient Instructions (Signed)
Mild Neurocognitive Disorder Mild neurocognitive disorder (formerly known as mild cognitive impairment) is a mental disorder. It is a slight abnormal decrease in mental function. The areas of mental function affected may include memory, thought, communication, behavior, and completion of tasks. The decrease is noticeable and measurable but for the most part does not interfere with your daily activities. Mild neurocognitive disorder typically occurs in people older than 60 years but can occur earlier. It is not as serious as major neurocognitive disorder (formerly known as dementia) but may lead to a more serious neurocognitive disorder. However, in some cases the condition does not get worse. A few people with this disorder even improve. CAUSES  There are a number of different causes of mild neurocognitive disorder:   Brain disorders associated with abnormal protein deposits, such as Alzheimer's disease, Pick's disease, and Lewy body disease.  Brain disorders associated with abnormal movement, such as Parkinson's disease and Huntington's disease.  Diseases affecting blood vessels in the brain and resulting in mini-strokes.  Certain infections, such as human immunodeficiency virus (HIV) infection.  Traumatic brain injury.  Other medical conditions such as brain tumors, underactive thyroid (hypothyroidism), and vitamin B12 deficiency.  Use of certain prescription medicine and "recreational" drugs. SYMPTOMS  Symptoms of mild neurocognitive disorder include:  Difficulty remembering. You may forget details of recent events, names, or phone numbers. You may forget important social events and appointments or repeatedly forget where you put your car keys.  Difficulty thinking and solving problems. You may have trouble with complex tasks such as paying bills or driving in unfamiliar locations.  Difficulty communicating. You may have trouble finding the right word, naming an object, forming a  sentence that makes sense, or understanding what you read or hear.  Changes in your behavior or personality. You may lose interest in the things that you used to enjoy or withdraw from social situations. You may get angry more easily than usual. You may act before thinking. You may do things in public that you would not usually do. You may hear or see things that are not real (hallucinations). You may believe falsely that others are trying to hurt you (paranoia). DIAGNOSIS Mild neurocognitive disorder is diagnosed through an assessment by your health care provider. Your health care provider will ask you and your family, friends, or coworkers questions about your symptoms. He or she will ask how often the symptoms occur, how long they have been occurring, whether they are getting worse, and the effect they are having on your life. Your health care provider may refer you to a neurologist or mental health specialist for a detailed evaluation of your mental functions (neuropsychological testing).  To identify the cause of your mild neurocognitive disorder, your health care provider may:  Obtain a detailed medical history.  Ask about alcohol and drug use, including prescription medicine.  Perform a physical exam.  Order blood tests and brain imaging exams. TREATMENT  Mild neurocognitive disorder caused by infections, use of certain medicines or "recreational" drugs, and certain medical conditions may improve with treatment of the condition that is causing the disorder. Mild neurocognitive disorder resulting from other causes generally does not improve and may worsen. In these cases, the goal of treatment is to slow progression of the disorder and help you cope with the loss of mental function. Treatments in these cases include:   Medicine. Medicine helps mainly with memory loss and behavioral symptoms.   Talk therapy. Talk therapy provides education, emotional support, memory aids, and other   include:   · Medicine. Medicine helps mainly with memory loss and behavioral symptoms.    · Talk therapy. Talk therapy provides education, emotional support, memory aids, and other ways of  making up for decreases in mental function.       · Lifestyle changes. These include regular exercise, a healthy diet (including essential omega-3 fatty acids), intellectual stimulation, and increased social interaction.  Document Released: 01/31/2013 Document Revised: 10/15/2013 Document Reviewed: 01/31/2013  [employer]® Patient Information ©2015 [employer]. This information is not intended to replace advice given to you by your health care provider. Make sure you discuss any questions you have with your health care provider.

## 2014-09-16 NOTE — Progress Notes (Signed)
   Subjective:    Patient ID: Dennis Harper, male    DOB: 03/15/1941, 74 y.o.   MRN: [id number]  HPI Follow-up regarding cognitive impairment. Refer to prior note. Patient had noted that his wife commented that he was frequently repeating himself. He does not think this is much problem yet. He scored 26 out of 30 on Mini-Mental status exam. B12 and TSH were normal. We have discussed possible initiation of medication such as Aricept or Namenda and he initially declined. He is willing to reconsider this time. We also discussed possible referral to neurologist but is not sure he wishes to proceed with that at this time. No recent headaches.  Practically, he has noticed problems with things like driving and sometimes finding difficulty with directions. Frequently misplaces things.  Past Medical History  Diagnosis Date  . Migraine   . GALLSTONES 01/06/2010    Qualifier: Diagnosis of  By: Elease Hashimoto MD, Andrell Tallman    . 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 Appendectomy      reports that he has quit smoking. He does not have any smokeless tobacco history on file. He reports that he does not drink alcohol or use illicit drugs. family history is negative for Alcohol abuse and Heart disease. No Known Allergies    Review of Systems  Constitutional: Negative for appetite change and unexpected weight change.  Neurological: Negative for dizziness and headaches.  Psychiatric/Behavioral: Negative for dysphoric mood.       Objective:   Physical Exam  Constitutional: He appears well-developed and well-nourished. No distress.  Cardiovascular: Normal rate and regular rhythm.   Pulmonary/Chest: Effort normal and breath sounds normal. No respiratory distress. He has no wheezes. He has no rales.  Neurological: He is alert. No cranial nerve deficit.          Assessment & Plan:  Cognitive  impairment with recent MMSE 26 out of 30. Patient wishes to reconsider medication options. We recommended Namenda starter pack and reassess in one month and hopefully his wife can accompany him then.

## 2014-09-16 NOTE — Progress Notes (Signed)
Pre visit review using our clinic review tool, if applicable. No additional management support is needed unless otherwise documented below in the visit note. 

## 2014-09-18 ENCOUNTER — Telehealth: Payer: Self-pay | Admitting: Family Medicine

## 2014-09-18 NOTE — Telephone Encounter (Signed)
[facility]

## 2014-09-18 NOTE — Telephone Encounter (Signed)
See if other pharmacies have this.

## 2014-09-18 NOTE — Telephone Encounter (Signed)
[facility] can not get the namenda it on  manufacture backorder. Please advise

## 2014-09-18 NOTE — Telephone Encounter (Signed)
Pt wife said she call [facility] and they are out. Pt call pt wife back

## 2014-09-18 NOTE — Telephone Encounter (Signed)
Lets take Namenda off list and start Aricept 5 mg once daily and reassess one month.

## 2014-09-19 MED ORDER — DONEPEZIL HCL 5 MG PO TABS: 5.0000 mg | ORAL_TABLET | Freq: Every day | ORAL | Status: DC

## 2014-09-19 NOTE — Telephone Encounter (Signed)
Rx sent to pharmacy. Pt is informed.

## 2014-10-16 ENCOUNTER — Encounter: Payer: Self-pay | Admitting: Family Medicine

## 2014-10-16 ENCOUNTER — Ambulatory Visit (INDEPENDENT_AMBULATORY_CARE_PROVIDER_SITE_OTHER): Payer: Medicare Other | Admitting: Family Medicine

## 2014-10-16 VITALS — BP 126/80 | HR 74 | Temp 98.4°F | Wt 175.0 lb

## 2014-10-16 DIAGNOSIS — R4189 Other symptoms and signs involving cognitive functions and awareness: Secondary | ICD-10-CM

## 2014-10-16 DIAGNOSIS — K4090 Unilateral inguinal hernia, without obstruction or gangrene, not specified as recurrent: Secondary | ICD-10-CM

## 2014-10-16 NOTE — Progress Notes (Signed)
Pre visit review using our clinic review tool, if applicable. No additional management support is needed unless otherwise documented below in the visit note. 

## 2014-10-16 NOTE — Progress Notes (Signed)
   Subjective:    Patient ID: Dennis Harper, male    DOB: January 10, 1941, 74 y.o.   MRN: [id number]  HPI Patient seen for the following  Cognitive impairment. Recent MMSE 26 out of 30. We recommended Namenda but apparently there was some national back order. We then tried Aricept 5 mg daily but patient had intolerance. He states this made him feel "weird". He stopped taking this after a few days. He is reluctant to take any medications at this time. He does not feel his memory post major problem. Recent B12 and thyroid normal. No headaches. No focal neurologic symptoms.  Right inguinal hernia. Has been noted previously. Nonpainful. Slowly growing in size. He is requesting referral to general surgeon this time  Past Medical History  Diagnosis Date  . Migraine   . GALLSTONES 01/06/2010    Qualifier: Diagnosis of  By: Elease Hashimoto MD, Willam Munford    . 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 Appendectomy      reports that he has quit smoking. He does not have any smokeless tobacco history on file. He reports that he does not drink alcohol or use illicit drugs. family history is negative for Alcohol abuse and Heart disease. No Known Allergies    Review of Systems  Constitutional: Negative for fatigue.  Eyes: Negative for visual disturbance.  Respiratory: Negative for cough, chest tightness and shortness of breath.   Cardiovascular: Negative for chest pain, palpitations and leg swelling.  Gastrointestinal: Negative for nausea, vomiting and abdominal pain.  Neurological: Negative for dizziness, syncope, weakness, light-headedness and headaches.       Objective:   Physical Exam  Constitutional: He is oriented to person, place, and time. He appears well-developed and well-nourished.  HENT:  Right Ear: External ear normal.  Left Ear: External ear normal.  Mouth/Throat: Oropharynx is clear and  moist.  Eyes: Pupils are equal, round, and reactive to light.  Neck: Neck supple. No thyromegaly present.  Cardiovascular: Normal rate and regular rhythm.   Pulmonary/Chest: Effort normal and breath sounds normal. No respiratory distress. He has no wheezes. He has no rales.  Abdominal: Soft. There is no tenderness.  Genitourinary:  Large right inguinal hernia. Soft and nontender.  Musculoskeletal: He exhibits no edema.  Neurological: He is alert and oriented to person, place, and time.          Assessment & Plan:  #1 cognitive impairment. Patient reluctant to try other medications. Intolerance with Aricept. Reassess 4 months and repeat MMSE then. Consider trial of Namenda that point if he is willing #2 right inguinal hernia. Set up to see general surgeon per patient request

## 2014-11-01 ENCOUNTER — Other Ambulatory Visit: Payer: Self-pay | Admitting: Surgery

## 2014-11-01 DIAGNOSIS — K4090 Unilateral inguinal hernia, without obstruction or gangrene, not specified as recurrent: Secondary | ICD-10-CM | POA: Diagnosis not present

## 2015-02-24 ENCOUNTER — Ambulatory Visit (INDEPENDENT_AMBULATORY_CARE_PROVIDER_SITE_OTHER): Payer: Medicare Other | Admitting: Family Medicine

## 2015-02-24 ENCOUNTER — Encounter: Payer: Self-pay | Admitting: Family Medicine

## 2015-02-24 VITALS — BP 120/80 | HR 76 | Temp 98.1°F | Wt 169.0 lb

## 2015-02-24 DIAGNOSIS — R4189 Other symptoms and signs involving cognitive functions and awareness: Secondary | ICD-10-CM

## 2015-02-24 MED ORDER — MEMANTINE HCL 28 X 5 MG & 21 X 10 MG PO TABS: ORAL_TABLET | ORAL | Status: DC

## 2015-02-24 MED ORDER — MEMANTINE HCL 10 MG PO TABS: 10.0000 mg | ORAL_TABLET | Freq: Two times a day (BID) | ORAL | Status: DC

## 2015-02-24 NOTE — Progress Notes (Signed)
Pre visit review using our clinic review tool, if applicable. No additional management support is needed unless otherwise documented below in the visit note. 

## 2015-02-24 NOTE — Patient Instructions (Signed)
Mild Neurocognitive Disorder Mild neurocognitive disorder (formerly known as mild cognitive impairment) is a mental disorder. It is a slight abnormal decrease in mental function. The areas of mental function affected may include memory, thought, communication, behavior, and completion of tasks. The decrease is noticeable and measurable but for the most part does not interfere with your daily activities. Mild neurocognitive disorder typically occurs in people older than 60 years but can occur earlier. It is not as serious as major neurocognitive disorder (formerly known as dementia) but may lead to a more serious neurocognitive disorder. However, in some cases the condition does not get worse. A few people with this disorder even improve. CAUSES  There are a number of different causes of mild neurocognitive disorder:   Brain disorders associated with abnormal protein deposits, such as Alzheimer's disease, Pick's disease, and Lewy body disease.  Brain disorders associated with abnormal movement, such as Parkinson's disease and Huntington's disease.  Diseases affecting blood vessels in the brain and resulting in mini-strokes.  Certain infections, such as human immunodeficiency virus (HIV) infection.  Traumatic brain injury.  Other medical conditions such as brain tumors, underactive thyroid (hypothyroidism), and vitamin B12 deficiency.  Use of certain prescription medicine and "recreational" drugs. SYMPTOMS  Symptoms of mild neurocognitive disorder include:  Difficulty remembering. You may forget details of recent events, names, or phone numbers. You may forget important social events and appointments or repeatedly forget where you put your car keys.  Difficulty thinking and solving problems. You may have trouble with complex tasks such as paying bills or driving in unfamiliar locations.  Difficulty communicating. You may have trouble finding the right word, naming an object, forming a  sentence that makes sense, or understanding what you read or hear.  Changes in your behavior or personality. You may lose interest in the things that you used to enjoy or withdraw from social situations. You may get angry more easily than usual. You may act before thinking. You may do things in public that you would not usually do. You may hear or see things that are not real (hallucinations). You may believe falsely that others are trying to hurt you (paranoia). DIAGNOSIS Mild neurocognitive disorder is diagnosed through an assessment by your health care provider. Your health care provider will ask you and your family, friends, or coworkers questions about your symptoms. He or she will ask how often the symptoms occur, how long they have been occurring, whether they are getting worse, and the effect they are having on your life. Your health care provider may refer you to a neurologist or mental health specialist for a detailed evaluation of your mental functions (neuropsychological testing).  To identify the cause of your mild neurocognitive disorder, your health care provider may:  Obtain a detailed medical history.  Ask about alcohol and drug use, including prescription medicine.  Perform a physical exam.  Order blood tests and brain imaging exams. TREATMENT  Mild neurocognitive disorder caused by infections, use of certain medicines or "recreational" drugs, and certain medical conditions may improve with treatment of the condition that is causing the disorder. Mild neurocognitive disorder resulting from other causes generally does not improve and may worsen. In these cases, the goal of treatment is to slow progression of the disorder and help you cope with the loss of mental function. Treatments in these cases include:   Medicine. Medicine helps mainly with memory loss and behavioral symptoms.   Talk therapy. Talk therapy provides education, emotional support, memory aids, and other   ways of  making up for decreases in mental function.   Lifestyle changes. These include regular exercise, a healthy diet (including essential omega-3 fatty acids), intellectual stimulation, and increased social interaction. Document Released: 01/31/2013 Document Revised: 10/15/2013 Document Reviewed: 01/31/2013 [facility] Patient Information 2015 [place], [place]. This information is not intended to replace advice given to you by your health care provider. Make sure you discuss any questions you have with your health care provider.  Start the Namenda titration pack first and this will last for one month Then, continue with the Namenda 10 mg twice daily

## 2015-02-24 NOTE — Progress Notes (Signed)
   Subjective:    Patient ID: Dennis Harper, male    DOB: 05/30/1941, 74 y.o.   MRN: [id number]  HPI Patient seen for follow-up regarding cognitive impairment. Refer to prior notes. MMSE 26 out of 30 last spring. Normal B12 and normal TSH. We initially started Namenda but there apparently was a Psychologist, prison and probation services and he states he could not get medication. Subsequently we started Aricept but he states he had severe insomnia. He is now here for follow-up He continues to work part-time. He did apparently see surgeon regarding his anal hernia. No complaints at this time.  Past Medical History  Diagnosis Date  . Migraine   . GALLSTONES 01/06/2010    Qualifier: Diagnosis of  By: Elease Hashimoto MD, Jackqulyn Mendel    . 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 Appendectomy      reports that he has quit smoking. He does not have any smokeless tobacco history on file. He reports that he does not drink alcohol or use illicit drugs. family history is negative for Alcohol abuse and Heart disease. No Known Allergies    Review of Systems  Constitutional: Negative for fatigue.  Eyes: Negative for visual disturbance.  Respiratory: Negative for cough, chest tightness and shortness of breath.   Cardiovascular: Negative for chest pain, palpitations and leg swelling.  Neurological: Negative for dizziness, syncope, weakness, light-headedness and headaches.       Objective:   Physical Exam  Constitutional: He appears well-developed and well-nourished.  Cardiovascular: Normal rate and regular rhythm.   Pulmonary/Chest: Effort normal and breath sounds normal. No respiratory distress. He has no wheezes. He has no rales.  Musculoskeletal: He exhibits no edema.  Psychiatric: He has a normal mood and affect. His behavior is normal.  MMSE 25/30.            Assessment & Plan:  Cognitive decline. Mini-Mental  Status exam 25 out of 30 which is 1. down from last spring. We've recommended starting Namenda titration pack building up to 10 mg twice daily. Reassess in 6 months. He had previous reported intolerance with Aricept

## 2015-03-07 ENCOUNTER — Telehealth: Payer: Self-pay | Admitting: Family Medicine

## 2015-03-07 DIAGNOSIS — R4189 Other symptoms and signs involving cognitive functions and awareness: Secondary | ICD-10-CM

## 2015-03-07 NOTE — Telephone Encounter (Signed)
Spoke with wife.  She reiterates that his short-term memory is declining. Both patient's wife and son insists that he see a neurologist. We will try to get this set up. He is currently taking Namenda titration pack

## 2015-03-07 NOTE — Telephone Encounter (Signed)
Patients wife wants talk with you about her husband. Feels there are some things that you need to know. Patient demanded to speak directly to you. I apologized and let her know that you were seeing patients and would call her back.

## 2015-04-16 ENCOUNTER — Ambulatory Visit (INDEPENDENT_AMBULATORY_CARE_PROVIDER_SITE_OTHER): Payer: Medicare Other | Admitting: Neurology

## 2015-04-16 ENCOUNTER — Encounter: Payer: Self-pay | Admitting: Neurology

## 2015-04-16 VITALS — BP 106/54 | HR 85 | Resp 16 | Wt 168.0 lb

## 2015-04-16 DIAGNOSIS — R413 Other amnesia: Secondary | ICD-10-CM | POA: Diagnosis not present

## 2015-04-16 NOTE — Patient Instructions (Signed)
1. Start taking Namenda 10mg  twice a day to help slow down progression of worsening memory 2. Recommendation is to have a scan of the brain. Please call our office once you are ready to have MRI brain done after discussing with your family. 3. Continue to monitor driving, if at any point you or family notice getting lost or turned around, please hold off on driving 4. Follow-up with Dr. Elease Hashimoto as scheduled

## 2015-04-16 NOTE — Progress Notes (Signed)
NEUROLOGY CONSULTATION NOTE  Dennis Harper MRN: [id number] DOB: 30-Apr-1941  Referring provider: Dr. Carolann Littler Primary care provider: Dr. Carolann Littler  Reason for consult:  Memory loss  Dear Dr Elease Hashimoto:  Thank you for your kind referral of Dennis Harper for consultation of the above symptoms. Although his history is well known to you, please allow me to reiterate it for the purpose of our medical record. The patient was accompanied to the clinic by his wife who also provides collateral information. Records and images were personally reviewed where available.  HISTORY OF PRESENT ILLNESS: This is a 74 year old right-handed man with no significant past medical history, presenting for evaluation of worsening memory. He has minimal insight into his condition and states that his memory is good. He does agree that he occasionally forgets names, "but doesn't everybody?" His wife started noticing memory changes around a year ago, noticing increasing forgetfulness. She apparently had a list of symptoms that she brought out for my medical assistant, but he became upset and she did not show this to me during their visit. She tells me that he would not recall when his doctor appointments were, so they started writing them down. He would misplace his glasses frequently. They deny any missed bill payments. She states he takes his medications independently, but he tells me he "purposely" does not take his medications as instructed because he feels he is on too much medication. He continues to drive daily and goes to his office for 4 hours everyday. His wife expressed concern about his driving, he was driving to the beach one time but when his wife called him, instead of going to [facility], he told her he was in a different location in [place]. Another time, he could not recall how to get to his dentist of 15 years. She discussed her concerns with Dr. Elease Hashimoto in March 2016. MMSE at that time was  26/30. TSH and B12 were normal. He was started on donepezil, but after taking 1 dose and having sleep difficulties, he stopped the medication. He was started on Namenda around a month ago, instructions indicate to take 1 tab BID, but he states he does not take it daily, wife reports he only takes 1 tablet 2 days a week.   He denies any headaches, dizziness, diplopia, dysarthria, dysphagia, neck/back pain, focal numbness/tingling/weakness, bowel/bladder dysfunction. No anosmia, tremors, no falls. He denies any significant head injuries. No family history of dementia. He drinks very little alcohol every 2 weeks. His wife denies any personality changes, no paranoia, stating he is very kind. He is however noted to be angry in the office today, refusing to do the [facility] and MMSE tests, saying he has not done this "since I was 1."  Laboratory Data: Lab Results  Component Value Date   WBC 6.0 01/12/2012   HGB 13.7 01/12/2012   HCT 41.2 01/12/2012   MCV 89.9 01/12/2012   PLT 219.0 01/12/2012     Chemistry      Component Value Date/Time   NA 141 01/12/2012 1001   K 4.4 01/12/2012 1001   CL 104 01/12/2012 1001   CO2 29 01/12/2012 1001   BUN 15 01/12/2012 1001   CREATININE 1.1 01/12/2012 1001      Component Value Date/Time   CALCIUM 8.9 01/12/2012 1001   ALKPHOS 41 01/12/2012 1001   AST 23 01/12/2012 1001   ALT 21 01/12/2012 1001   BILITOT 0.7 01/12/2012 1001     Lab  Results  Component Value Date   TSH 1.79 08/23/2014   Lab Results  Component Value Date   [id number] 098 08/23/2014     PAST MEDICAL HISTORY: Past Medical History  Diagnosis Date  . Migraine   . GALLSTONES 01/06/2010    Qualifier: Diagnosis of  By: Elease Hashimoto MD, Bruce    . 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 Appendectomy      MEDICATIONS: Current Outpatient Prescriptions on File  Prior to Visit  Medication Sig Dispense Refill  . beta carotene w/minerals (OCUVITE) tablet Take 1 tablet by mouth daily.    . memantine (NAMENDA) 10 MG tablet Take 1 tablet (10 mg total) by mouth 2 (two) times daily. 60 tablet 11  . vitamin B-12 (CYANOCOBALAMIN) 100 MCG tablet Take 50 mcg by mouth daily.     No current facility-administered medications on file prior to visit.    ALLERGIES: No Known Allergies  FAMILY HISTORY: Family History  Problem Relation Age of Onset  . Alcohol abuse Neg Hx     family hx  . Heart disease Neg Hx     family hx    SOCIAL HISTORY: Social History   Social History  . Marital Status: Married    Spouse Name: N/A  . Number of Children: 1  . Years of Education: N/A   Occupational History  . Office Work    Social History Main Topics  . Smoking status: Former Smoker    Types: Cigarettes  . Smokeless tobacco: Never Used  . Alcohol Use: No  . Drug Use: No  . Sexual Activity: Not Currently   Other Topics Concern  . Not on file   Social History Narrative    REVIEW OF SYSTEMS: Constitutional: No fevers, chills, or sweats, no generalized fatigue, change in appetite Eyes: No visual changes, double vision, eye pain Ear, nose and throat: No hearing loss, ear pain, nasal congestion, sore throat Cardiovascular: No chest pain, palpitations Respiratory:  No shortness of breath at rest or with exertion, wheezes GastrointestinaI: No nausea, vomiting, diarrhea, abdominal pain, fecal incontinence Genitourinary:  No dysuria, urinary retention or frequency Musculoskeletal:  No neck pain, back pain Integumentary: No rash, pruritus, skin lesions Neurological: as above Psychiatric: No depression, insomnia, anxiety Endocrine: No palpitations, fatigue, diaphoresis, mood swings, change in appetite, change in weight, increased thirst Hematologic/Lymphatic:  No anemia, purpura, petechiae. Allergic/Immunologic: no itchy/runny eyes, nasal congestion, recent  allergic reactions, rashes  PHYSICAL EXAM: Filed Vitals:   04/16/15 0840  BP: 106/54  Pulse: 85  Resp: 16   General: No acute distress, became increasingly angry and upset during the visit, refusing to do MOCA or MMSE testing. Head:  Normocephalic/atraumatic Eyes: Fundoscopic exam shows bilateral sharp discs, no vessel changes, exudates, or hemorrhages Neck: supple, no paraspinal tenderness, full range of motion Back: No paraspinal tenderness Heart: regular rate and rhythm Lungs: Clear to auscultation bilaterally. Vascular: No carotid bruits. Skin/Extremities: No rash, no edema Neurological Exam: Mental status: alert and oriented to person, place, month, chose correct year with multiple choice, no dysarthria or aphasia, Fund of knowledge is appropriate.  Remote memory intact. 0/3 delayed recall.  Attention and concentration are normal.    Able to name objects and repeat phrases. Refused to do the rest of MOCA or MMSE. Cranial nerves: CN I: not tested CN II: pupils equal, round and reactive to  light, visual fields intact, fundi unremarkable. CN III, IV, VI:  full range of motion, no nystagmus, no ptosis CN V: facial sensation intact CN VII: upper and lower face symmetric CN VIII: hearing intact to finger rub CN IX, X: gag intact, uvula midline CN XI: sternocleidomastoid and trapezius muscles intact CN XII: tongue midline Bulk & Tone: normal, no fasciculations. Motor: 5/5 throughout with no pronator drift. Sensation: decreased vibration to left ankle, otherwise intact to light touch, cold, pin, and joint position sense.  No extinction to double simultaneous stimulation.  Romberg test negative Deep Tendon Reflexes: +2 throughout, no ankle clonus Plantar responses: downgoing bilaterally Cerebellar: no incoordination on finger to nose testomg Gait: narrow-based and steady, able to tandem walk adequately. Tremor: none  IMPRESSION: This is a 74 year old right-handed man presenting  for evaluation of worsening memory. His wife expressed concerns about short-term memory, one time he drove to [place] instead of [place] and another time he could not recall how to get to his dentist of 15 years. He became increasingly upset in the office, refusing to do [facility] or MMSE testing, refusing to take pen and write anything down. He recalled 0/3 objects after 5 minutes. His MMSE with PCP in March 2016 was 26/30. By history, symptoms suggestive of mild cognitive impairment, possible mild dementia, but difficult to determine as patient very uncooperative with minimal insight into his condition. We discussed doing an MRI brain without contrast to assess for underlying structural abnormality and vascular load. He said this is "overkill by doctors" and I discussed with him that I can only make recommendations and it is up to the patient and his family to discuss proceeding with MRI. We also discussed driving, his family should continue to monitor and he expressed understanding that if he/family notice getting lost/turned around, he should start to hold off on driving. I discussed continuation of Namenda, but that he should take this regularly twice a day to potentially help slow down progression of worsening memory. I discussed with his wife the expectations from medication and natural history of dementia/worsening memory. He will continue follow-up with PCP as scheduled and knows to call us for any changes, otherwise follow-up on an as needed basis.  Thank you for allowing me to participate in the care of this patient. Please do not hesitate to call for any questions or concerns.   Ellouise Newer, M.D.  CC: Dr. Elease Hashimoto

## 2015-04-30 ENCOUNTER — Encounter: Payer: Self-pay | Admitting: Family Medicine

## 2015-04-30 ENCOUNTER — Ambulatory Visit (INDEPENDENT_AMBULATORY_CARE_PROVIDER_SITE_OTHER): Payer: Medicare Other | Admitting: Family Medicine

## 2015-04-30 VITALS — BP 109/76 | HR 75 | Temp 98.4°F | Resp 20 | Wt 168.5 lb

## 2015-04-30 DIAGNOSIS — J209 Acute bronchitis, unspecified: Secondary | ICD-10-CM | POA: Insufficient documentation

## 2015-04-30 MED ORDER — DOXYCYCLINE HYCLATE 100 MG PO TABS: 100.0000 mg | ORAL_TABLET | Freq: Two times a day (BID) | ORAL | Status: DC

## 2015-04-30 NOTE — Patient Instructions (Signed)
I have called in an antibiotic for you to take for 10 days, two times a day.  Try to take a daily antihistamine (claritin or allegra) to help with allergy like symptoms.  Try over the counter medications for cough.   Acute Bronchitis Bronchitis is inflammation of the airways that extend from the windpipe into the lungs (bronchi). The inflammation often causes mucus to develop. This leads to a cough, which is the most common symptom of bronchitis.  In acute bronchitis, the condition usually develops suddenly and goes away over time, usually in a couple weeks. Smoking, allergies, and asthma can make bronchitis worse. Repeated episodes of bronchitis may cause further lung problems.  CAUSES Acute bronchitis is most often caused by the same virus that causes a cold. The virus can spread from person to person (contagious) through coughing, sneezing, and touching contaminated objects. SIGNS AND SYMPTOMS   Cough.   Fever.   Coughing up mucus.   Body aches.   Chest congestion.   Chills.   Shortness of breath.   Sore throat.  DIAGNOSIS  Acute bronchitis is usually diagnosed through a physical exam. Your health care provider will also ask you questions about your medical history. Tests, such as chest X-rays, are sometimes done to rule out other conditions.  TREATMENT  Acute bronchitis usually goes away in a couple weeks. Oftentimes, no medical treatment is necessary. Medicines are sometimes given for relief of fever or cough. Antibiotic medicines are usually not needed but may be prescribed in certain situations. In some cases, an inhaler may be recommended to help reduce shortness of breath and control the cough. A cool mist vaporizer may also be used to help thin bronchial secretions and make it easier to clear the chest.  HOME CARE INSTRUCTIONS  Get plenty of rest.   Drink enough fluids to keep your urine clear or pale yellow (unless you have a medical condition that requires fluid  restriction). Increasing fluids may help thin your respiratory secretions (sputum) and reduce chest congestion, and it will prevent dehydration.   Take medicines only as directed by your health care provider.  If you were prescribed an antibiotic medicine, finish it all even if you start to feel better.  Avoid smoking and secondhand smoke. Exposure to cigarette smoke or irritating chemicals will make bronchitis worse. If you are a smoker, consider using nicotine gum or skin patches to help control withdrawal symptoms. Quitting smoking will help your lungs heal faster.   Reduce the chances of another bout of acute bronchitis by washing your hands frequently, avoiding people with cold symptoms, and trying not to touch your hands to your mouth, nose, or eyes.   Keep all follow-up visits as directed by your health care provider.  SEEK MEDICAL CARE IF: Your symptoms do not improve after 1 week of treatment.  SEEK IMMEDIATE MEDICAL CARE IF:  You develop an increased fever or chills.   You have chest pain.   You have severe shortness of breath.  You have bloody sputum.   You develop dehydration.  You faint or repeatedly feel like you are going to pass out.  You develop repeated vomiting.  You develop a severe headache. MAKE SURE YOU:   Understand these instructions.  Will watch your condition.  Will get help right away if you are not doing well or get worse.   This information is not intended to replace advice given to you by your health care provider. Make sure you discuss any questions you  have with your health care provider.   Document Released: 07/08/2004 Document Revised: 06/21/2014 Document Reviewed: 11/21/2012 Elsevier Interactive Patient Education [employer].

## 2015-04-30 NOTE — Progress Notes (Signed)
   Subjective:    Patient ID: Dennis Harper, male    DOB: 03-Nov-1940, 74 y.o.   MRN: [id number]  HPI  Cough: Dennis Harper is a 74 year old male presents to acute office visits today with 2-1/2 week complaint of cough. Patient states his symptoms started with a mild cough, that has progressed over the last few days to be mildly productive. He states he feels like there is just phlegm stuck in his throat, he states when he is able to produce phlegm it is clear. He reports over the last few days his chest cold has progressed to include nasal congestion, rhinorrhea, low-grade fever and mild sore throat. Patient denies any itchy or watery eyes, he denies any ear discomfort or pain. He denies any facial pressure or headache. Patient denies any nausea, vomit or diarrhea and states that he is eating and drinking well. Patient reports he does not have a history of allergies in the past. He has not taking any over-the-counter medications to help with his symptoms.   Former Smoker  Past Medical History  Diagnosis Date  . Migraine   . GALLSTONES 01/06/2010    Qualifier: Diagnosis of  By: Elease Hashimoto MD, Bruce    . 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 No acute distress. Nontoxic in appearance, well-developed, well-nourished, Caucasian male. Very pleasant. Mildly forgetful. HENT: AT. [place]. Bilateral TM visualized and normal in appearance. MMM. Bilateral nares without erythema or swelling. Throat without erythema or exudates. No cough on exam, mild hoarseness with nasal congestion present on exam. Eyes:Pupils Equal Round Reactive to light, Extraocular movements intact,  Conjunctiva without redness,  discharge or icterus. Neck/lymp/endocrine: Supple, no lymphadenopathy CV: RRR  Chest: CTAB, no wheeze or crackles. Minimal rhonchi anterior chest. Good air movement. Good respiratory effort.    Assessment & Plan:  1. Acute bronchitis, unspecified organism - Patient has signs and symptoms of bronchitis on exam today, considering duration of symptoms will treat with doxycycline twice a day 10 days. Patient encouraged to start an antihistamine daily such as Claritin or Allegra. - AVS on bronchitis provided today for education. - Patient was encouraged to rest, maintain good hydration, and use over-the-counter cough syrup if needed. - doxycycline (VIBRA-TABS) 100 MG tablet; Take 1 tablet (100 mg total) by mouth 2 (two) times daily.  Dispense: 20 tablet; Refill: 0  Follow-up with PCP in one week if no improvement.

## 2015-07-28 ENCOUNTER — Other Ambulatory Visit: Payer: Self-pay | Admitting: Surgery

## 2015-07-28 DIAGNOSIS — K4090 Unilateral inguinal hernia, without obstruction or gangrene, not specified as recurrent: Secondary | ICD-10-CM | POA: Diagnosis not present

## 2015-08-04 ENCOUNTER — Encounter (HOSPITAL_BASED_OUTPATIENT_CLINIC_OR_DEPARTMENT_OTHER): Payer: Self-pay | Admitting: *Deleted

## 2015-08-05 NOTE — H&P (Signed)
Dennis Harper  Location: [facility] Patient #: [id number] DOB: September 28, 1940 Married / Language: Undefined / Race: Undefined Male   History of Present Illness Patient words: RIH.  The patient is a 75 year old male who presents with an inguinal hernia. This is a pleasant gentleman referred by Dr. Elease Hashimoto for the evaluation of a right inguinal hernia. The patient reports noticing a hernia about 6 months ago. He reports that it always easily reducible and he has no discomfort whatsoever. He is otherwise healthy without complaints. He has had no obstructive symptoms.   Other Problems  Back Pain  Past Surgical History Appendectomy Vasectomy  Diagnostic Studies History  Colonoscopy 1-5 years ago  Allergies  No Known Drug Allergies05/20/2016  Medication History Donepezil HCl (5MG  Tablet Disperse, Oral DAily) Active. Medications Reconciled  Social History Alcohol use Occasional alcohol use. Caffeine use Coffee. No drug use Tobacco use Current some day smoker.  Family History  Arthritis Mother.    Review of Systems Elbert Ewings CMA; 11/01/2014 11:00 AM) General Not Present- Appetite Loss, Chills, Fatigue, Fever, Night Sweats, Weight Gain and Weight Loss. Skin Not Present- Change in Wart/Mole, Dryness, Hives, Jaundice, New Lesions, Non-Healing Wounds, Rash and Ulcer. HEENT Present- Hearing Loss, Ringing in the Ears and Wears glasses/contact lenses. Not Present- Earache, Hoarseness, Nose Bleed, Oral Ulcers, Seasonal Allergies, Sinus Pain, Sore Throat, Visual Disturbances and Yellow Eyes. Respiratory Not Present- Bloody sputum, Chronic Cough, Difficulty Breathing, Snoring and Wheezing. Breast Not Present- Breast Mass, Breast Pain, Nipple Discharge and Skin Changes. Cardiovascular Not Present- Chest Pain, Difficulty Breathing Lying Down, Leg Cramps, Palpitations, Rapid Heart Rate, Shortness of Breath and Swelling of Extremities. Gastrointestinal Not  Present- Abdominal Pain, Bloating, Bloody Stool, Change in Bowel Habits, Chronic diarrhea, Constipation, Difficulty Swallowing, Excessive gas, Gets full quickly at meals, Hemorrhoids, Indigestion, Nausea, Rectal Pain and Vomiting. Male Genitourinary Not Present- Blood in Urine, Change in Urinary Stream, Frequency, Impotence, Nocturia, Painful Urination, Urgency and Urine Leakage.  Vitals   Weight: 174 lb Height: 70in Body Surface Area: 1.97 m Body Mass Index: 24.97 kg/m  Temp.: 98.56F(Oral)  Pulse: 78 (Regular)  Resp.: 17 (Unlabored)  BP: 128/82 (Sitting, Left Arm, Standard)    Physical Exam General Mental Status-Alert. General Appearance-Consistent with stated age. Hydration-Well hydrated. Voice-Normal.  Head and Neck Head-normocephalic, atraumatic with no lesions or palpable masses. Trachea-midline.  Eye Eyeball - Bilateral-Extraocular movements intact. Sclera/Conjunctiva - Bilateral-No scleral icterus.  Chest and Lung Exam Chest and lung exam reveals -quiet, even and easy respiratory effort with no use of accessory muscles and on auscultation, normal breath sounds, no adventitious sounds and normal vocal resonance. Inspection Chest Wall - Normal. Back - normal.  Cardiovascular Cardiovascular examination reveals -normal heart sounds, regular rate and rhythm with no murmurs and normal pedal pulses bilaterally.  Abdomen Inspection Skin - Scar - no surgical scars. Hernias - Inguinal hernia - Right - Reducible. Palpation/Percussion Palpation and Percussion of the abdomen reveal - Soft, Non Tender, No Rebound tenderness, No Rigidity (guarding) and No hepatosplenomegaly. Auscultation Auscultation of the abdomen reveals - Bowel sounds normal.  Neurologic Neurologic evaluation reveals -alert and oriented x 3 with no impairment of recent or remote memory. Mental Status-Normal.  Musculoskeletal Normal Exam - Left-Upper Extremity  Strength Normal and Lower Extremity Strength Normal. Normal Exam - Right-Upper Extremity Strength Normal, Lower Extremity Weakness.    Assessment & Plan  RIGHT INGUINAL HERNIA (550.90  K40.90)  Impression: I discussed the diagnosis with him in detail. I discussed hernia repair with  mesh. I discussed both the laparoscopic and open techniques with mesh. I discussed the risk of surgery which includes but is not limited to bleeding, infection, injury to surrounding structures, nerve entrapment, chronic pain, recurrence, etc. He wishes to proceed with an open right inguinal hernia repair with mesh

## 2015-08-06 ENCOUNTER — Ambulatory Visit (HOSPITAL_BASED_OUTPATIENT_CLINIC_OR_DEPARTMENT_OTHER)
Admission: RE | Admit: 2015-08-06 | Discharge: 2015-08-06 | Disposition: A | Discharge status: Home / Self Care | Payer: Medicare Other | Source: Ambulatory Visit | Attending: Surgery | Admitting: Surgery

## 2015-08-06 ENCOUNTER — Encounter (HOSPITAL_BASED_OUTPATIENT_CLINIC_OR_DEPARTMENT_OTHER): Payer: Self-pay | Admitting: *Deleted

## 2015-08-06 ENCOUNTER — Ambulatory Visit (HOSPITAL_BASED_OUTPATIENT_CLINIC_OR_DEPARTMENT_OTHER): Payer: Medicare Other | Admitting: Certified Registered"

## 2015-08-06 ENCOUNTER — Encounter (HOSPITAL_BASED_OUTPATIENT_CLINIC_OR_DEPARTMENT_OTHER)
Admission: RE | Disposition: A | Discharge status: Home / Self Care | Payer: Self-pay | Source: Ambulatory Visit | Attending: Surgery

## 2015-08-06 DIAGNOSIS — K219 Gastro-esophageal reflux disease without esophagitis: Secondary | ICD-10-CM | POA: Insufficient documentation

## 2015-08-06 DIAGNOSIS — F17200 Nicotine dependence, unspecified, uncomplicated: Secondary | ICD-10-CM | POA: Insufficient documentation

## 2015-08-06 DIAGNOSIS — K4090 Unilateral inguinal hernia, without obstruction or gangrene, not specified as recurrent: Secondary | ICD-10-CM | POA: Diagnosis not present

## 2015-08-06 DIAGNOSIS — R109 Unspecified abdominal pain: Secondary | ICD-10-CM | POA: Diagnosis not present

## 2015-08-06 DIAGNOSIS — G8918 Other acute postprocedural pain: Secondary | ICD-10-CM | POA: Diagnosis not present

## 2015-08-06 HISTORY — DX: Gastro-esophageal reflux disease without esophagitis: K21.9

## 2015-08-06 HISTORY — PX: INSERTION OF MESH: SHX5868

## 2015-08-06 HISTORY — PX: INGUINAL HERNIA REPAIR: SHX194

## 2015-08-06 SURGERY — REPAIR, HERNIA, INGUINAL, ADULT
Anesthesia: Regional | Site: Groin | Laterality: Right

## 2015-08-06 MED ORDER — MIDAZOLAM HCL 2 MG/2ML IJ SOLN: 1.0000 mg | INTRAMUSCULAR | Status: DC | PRN

## 2015-08-06 MED ORDER — CEFAZOLIN SODIUM-DEXTROSE 2-3 GM-% IV SOLR
2.0000 g | INTRAVENOUS | Status: AC
  Administered 2015-08-06: 2 g via INTRAVENOUS

## 2015-08-06 MED ORDER — FENTANYL CITRATE (PF) 100 MCG/2ML IJ SOLN
INTRAMUSCULAR | Status: AC
  Filled 2015-08-06: qty 2

## 2015-08-06 MED ORDER — LACTATED RINGERS IV SOLN
INTRAVENOUS | Status: DC
  Administered 2015-08-06 (×2): via INTRAVENOUS

## 2015-08-06 MED ORDER — FENTANYL CITRATE (PF) 100 MCG/2ML IJ SOLN
50.0000 ug | INTRAMUSCULAR | Status: AC | PRN
  Administered 2015-08-06 (×2): 50 ug via INTRAVENOUS
  Administered 2015-08-06: 25 ug via INTRAVENOUS
  Administered 2015-08-06: 100 ug via INTRAVENOUS

## 2015-08-06 MED ORDER — ONDANSETRON HCL 4 MG/2ML IJ SOLN
INTRAMUSCULAR | Status: DC | PRN
  Administered 2015-08-06: 4 mg via INTRAVENOUS

## 2015-08-06 MED ORDER — EPHEDRINE SULFATE 50 MG/ML IJ SOLN
INTRAMUSCULAR | Status: DC | PRN
  Administered 2015-08-06: 10 mg via INTRAVENOUS

## 2015-08-06 MED ORDER — SCOPOLAMINE 1 MG/3DAYS TD PT72: 1.0000 | MEDICATED_PATCH | Freq: Once | TRANSDERMAL | Status: DC | PRN

## 2015-08-06 MED ORDER — ONDANSETRON HCL 4 MG/2ML IJ SOLN
INTRAMUSCULAR | Status: AC
  Filled 2015-08-06: qty 2

## 2015-08-06 MED ORDER — CEFAZOLIN SODIUM-DEXTROSE 2-3 GM-% IV SOLR
INTRAVENOUS | Status: AC
  Filled 2015-08-06: qty 50

## 2015-08-06 MED ORDER — PROPOFOL 10 MG/ML IV BOLUS
INTRAVENOUS | Status: DC | PRN
  Administered 2015-08-06: 150 mg via INTRAVENOUS

## 2015-08-06 MED ORDER — DEXAMETHASONE SODIUM PHOSPHATE 10 MG/ML IJ SOLN
INTRAMUSCULAR | Status: AC
  Filled 2015-08-06: qty 1

## 2015-08-06 MED ORDER — BUPIVACAINE-EPINEPHRINE 0.5% -1:200000 IJ SOLN
INTRAMUSCULAR | Status: DC | PRN
  Administered 2015-08-06: 10 mL

## 2015-08-06 MED ORDER — MIDAZOLAM HCL 2 MG/2ML IJ SOLN
INTRAMUSCULAR | Status: AC
  Filled 2015-08-06: qty 2

## 2015-08-06 MED ORDER — GLYCOPYRROLATE 0.2 MG/ML IJ SOLN: 0.2000 mg | Freq: Once | INTRAMUSCULAR | Status: DC | PRN

## 2015-08-06 MED ORDER — LIDOCAINE HCL (CARDIAC) 20 MG/ML IV SOLN
INTRAVENOUS | Status: AC
  Filled 2015-08-06: qty 5

## 2015-08-06 MED ORDER — HYDROCODONE-ACETAMINOPHEN 5-325 MG PO TABS: 1.0000 | ORAL_TABLET | ORAL | Status: DC | PRN

## 2015-08-06 MED ORDER — LIDOCAINE HCL (CARDIAC) 20 MG/ML IV SOLN
INTRAVENOUS | Status: DC | PRN
  Administered 2015-08-06: 30 mg via INTRAVENOUS

## 2015-08-06 MED ORDER — BUPIVACAINE-EPINEPHRINE (PF) 0.5% -1:200000 IJ SOLN
INTRAMUSCULAR | Status: DC | PRN
  Administered 2015-08-06: 20 mL via PERINEURAL

## 2015-08-06 MED ORDER — DEXAMETHASONE SODIUM PHOSPHATE 4 MG/ML IJ SOLN
INTRAMUSCULAR | Status: DC | PRN
  Administered 2015-08-06: 10 mg via INTRAVENOUS

## 2015-08-06 SURGICAL SUPPLY — 45 items
BLADE CLIPPER SURG (BLADE) ×2 IMPLANT
BLADE HEX COATED 2.75 (ELECTRODE) ×2 IMPLANT
BLADE SURG 15 STRL LF DISP TIS (BLADE) ×1 IMPLANT
BLADE SURG 15 STRL SS (BLADE) ×1
CANISTER SUCTION 1200CC (MISCELLANEOUS) IMPLANT
CHLORAPREP W/TINT 26ML (MISCELLANEOUS) ×2 IMPLANT
COVER BACK TABLE 60X90IN (DRAPES) ×2 IMPLANT
COVER MAYO STAND STRL (DRAPES) ×2 IMPLANT
DECANTER SPIKE VIAL GLASS SM (MISCELLANEOUS) IMPLANT
DRAIN PENROSE 1/2X12 LTX STRL (WOUND CARE) ×2 IMPLANT
DRAPE LAPAROTOMY 100X72 PEDS (DRAPES) ×2 IMPLANT
DRAPE UTILITY XL STRL (DRAPES) ×2 IMPLANT
ELECT REM PT RETURN 9FT ADLT (ELECTROSURGICAL) ×2
ELECTRODE REM PT RTRN 9FT ADLT (ELECTROSURGICAL) ×1 IMPLANT
GLOVE BIOGEL PI IND STRL 7.0 (GLOVE) ×2 IMPLANT
GLOVE BIOGEL PI INDICATOR 7.0 (GLOVE) ×2
GLOVE ECLIPSE 6.5 STRL STRAW (GLOVE) ×4 IMPLANT
GLOVE EXAM NITRILE MD LF STRL (GLOVE) ×2 IMPLANT
GLOVE SURG SIGNA 7.5 PF LTX (GLOVE) ×2 IMPLANT
GOWN STRL REUS W/ TWL LRG LVL3 (GOWN DISPOSABLE) ×2 IMPLANT
GOWN STRL REUS W/ TWL XL LVL3 (GOWN DISPOSABLE) ×1 IMPLANT
GOWN STRL REUS W/TWL LRG LVL3 (GOWN DISPOSABLE) ×2
GOWN STRL REUS W/TWL XL LVL3 (GOWN DISPOSABLE) ×1
LIQUID BAND (GAUZE/BANDAGES/DRESSINGS) ×2 IMPLANT
MESH PARIETEX PROGRIP RIGHT (Mesh General) ×2 IMPLANT
NEEDLE HYPO 25X1 1.5 SAFETY (NEEDLE) ×2 IMPLANT
NS IRRIG 1000ML POUR BTL (IV SOLUTION) IMPLANT
PACK BASIN DAY SURGERY FS (CUSTOM PROCEDURE TRAY) ×2 IMPLANT
PENCIL BUTTON HOLSTER BLD 10FT (ELECTRODE) ×2 IMPLANT
SLEEVE SCD COMPRESS KNEE MED (MISCELLANEOUS) ×2 IMPLANT
SPONGE INTESTINAL PEANUT (DISPOSABLE) IMPLANT
SPONGE LAP 4X18 X RAY DECT (DISPOSABLE) ×2 IMPLANT
SUT MNCRL AB 4-0 PS2 18 (SUTURE) ×2 IMPLANT
SUT SILK 2 0 SH (SUTURE) ×2 IMPLANT
SUT SURG 0 T 19/GS 22 1969 62 (SUTURE) IMPLANT
SUT VIC AB 2-0 CT1 27 (SUTURE) ×2
SUT VIC AB 2-0 CT1 TAPERPNT 27 (SUTURE) ×2 IMPLANT
SUT VIC AB 3-0 CT1 27 (SUTURE) ×1
SUT VIC AB 3-0 CT1 27XBRD (SUTURE) ×1 IMPLANT
SYR BULB 3OZ (MISCELLANEOUS) IMPLANT
SYR CONTROL 10ML LL (SYRINGE) ×2 IMPLANT
TOWEL OR 17X24 6PK STRL BLUE (TOWEL DISPOSABLE) ×2 IMPLANT
TOWEL OR NON WOVEN STRL DISP B (DISPOSABLE) ×2 IMPLANT
TUBE CONNECTING 20X1/4 (TUBING) IMPLANT
YANKAUER SUCT BULB TIP NO VENT (SUCTIONS) IMPLANT

## 2015-08-06 NOTE — Anesthesia Preprocedure Evaluation (Signed)
Anesthesia Evaluation  Patient identified by MRN, date of birth, ID band Patient awake    Reviewed: Allergy & Precautions, NPO status , Patient's Chart, lab work & pertinent test results  Airway Mallampati: II   Neck ROM: full    Dental   Pulmonary Current Smoker,    breath sounds clear to auscultation       Cardiovascular negative cardio ROS   Rhythm:regular Rate:Normal     Neuro/Psych    GI/Hepatic GERD  ,  Endo/Other    Renal/GU      Musculoskeletal   Abdominal   Peds  Hematology   Anesthesia Other Findings   Reproductive/Obstetrics                             Anesthesia Physical Anesthesia Plan  ASA: II  Anesthesia Plan: General and Regional   Post-op Pain Management: MAC Combined w/ Regional for Post-op pain   Induction: Intravenous  Airway Management Planned: LMA  Additional Equipment:   Intra-op Plan:   Post-operative Plan:   Informed Consent: I have reviewed the patients History and Physical, chart, labs and discussed the procedure including the risks, benefits and alternatives for the proposed anesthesia with the patient or authorized representative who has indicated his/her understanding and acceptance.     Plan Discussed with: CRNA, Anesthesiologist and Surgeon  Anesthesia Plan Comments:         Anesthesia Quick Evaluation

## 2015-08-06 NOTE — Op Note (Signed)
RIGHT INGUINAL HERNIA REPAIR WITH MESH, INSERTION OF MESH  Procedure Note  SHAKEEL DRZEWIECKI 08/06/2015   Pre-op Diagnosis: Right inguinal hernia     Post-op Diagnosis: same  Procedure(s): RIGHT INGUINAL HERNIA REPAIR WITH MESH INSERTION OF MESH  Surgeon(s): Coralie Keens, MD  Anesthesia: General  Staff:  Circulator: Maurene Capes, RN Relief Circulator: Lisbeth Ply, RN Relief Scrub: Theressa Stamps, CST Scrub Person: Tresa Res, RN  Estimated Blood Loss: Minimal                         Lesle Faron A   Date: 08/06/2015  Time: 1:21 PM

## 2015-08-06 NOTE — Anesthesia Postprocedure Evaluation (Signed)
Anesthesia Post Note  Patient: Dennis Harper  Procedure(s) Performed: Procedure(s) (LRB): RIGHT INGUINAL HERNIA REPAIR WITH MESH (Right) INSERTION OF MESH (Right)  Patient location during evaluation: PACU Anesthesia Type: General Level of consciousness: awake and alert and patient cooperative Pain management: pain level controlled Vital Signs Assessment: post-procedure vital signs reviewed and stable Respiratory status: spontaneous breathing and respiratory function stable Cardiovascular status: stable Anesthetic complications: no    Last Vitals:  Filed Vitals:   08/06/15 1400 08/06/15 1415  BP: 125/61   Pulse: 78 79  Temp:    Resp: 8 12    Last Pain:  Filed Vitals:   08/06/15 1423  PainSc: 0-No pain                 [name] S

## 2015-08-06 NOTE — Progress Notes (Deleted)
Assisted Dr. Hodierne with right, ultrasound guided, femoral block. Side rails up, monitors on throughout procedure. See vital signs in flow sheet. Tolerated Procedure well. 

## 2015-08-06 NOTE — Op Note (Signed)
NAME:  [name], [name] NO.:  [id number]  MEDICAL RECORD NO.:  [id number]  LOCATION:                                 FACILITY:  PHYSICIAN:  Coralie Keens, M.D. DATE OF BIRTH:  Jan 18, 1941  DATE OF PROCEDURE:  08/06/2015 DATE OF DISCHARGE:                              OPERATIVE REPORT   PREOPERATIVE DIAGNOSIS:  Right inguinal hernia.  POSTOPERATIVE DIAGNOSIS:  Right inguinal hernia.  PROCEDURE:  Right inguinal hernia repair with mesh.  SURGEON:  Coralie Keens, M.D.  ANESTHESIA:  General with TAP block provided by Anesthesia.  ESTIMATED BLOOD LOSS:  Minimal.  FINDINGS:  The patient was found to have an indirect right inguinal hernia that was repaired with a piece of Proceed Prolene ProGrip mesh.  PROCEDURE IN DETAIL:  The patient was brought to the operating room, identified as Dennis Harper.  He was placed supine on the operating room table and general anesthesia was induced.  His right lower quadrant was then prepped and draped in usual sterile fashion.  I anesthetized the skin with Marcaine and then made a longitudinal incision in the patient's right lower quadrant with a scalpel.  I took this down through the Scarpa fascia with electrocautery.  The external oblique fascia was then identified and opened through the internal and external rings.  The testicular cord and structures were controlled with Penrose drain.  The patient had a very large indirect thick hernia sac.  They were separated from the cord structures.  During this time, I did have to tie off a small vein to the testicle with silk suture.  I then identified the base of the hernia sac.  All contents were reduced back into the abdominal cavity.  I tied off the base of the sac with a 2-0 silk suture and then excised the redundant sac.  A piece of Proceed Prolene ProGrip mesh was then brought into the field.  I placed it against the pubic tubercle and brought around the cord structures.  I  then secured in place with a 2-0 Vicryl suture.  This secured well to the surrounding soft tissue and shelving edge of the inguinal ligament.  I then closed the external oblique fascia over the top of the mesh with a 2-0 running Vicryl suture.  Scarpa fascia was closed with inverted 3-0 Vicryl sutures, and the skin was closed with running 4-0 Monocryl.  Skin glue was then applied.  The patient tolerated the procedure well.  All the counts were correct at the end of the procedure.  The patient was then extubated in the operating room and taken in stable condition to the recovery room.     Coralie Keens, M.D.     DB/MEDQ  D:  08/06/2015  T:  08/06/2015  Job:  [id number]

## 2015-08-06 NOTE — Discharge Instructions (Signed)
[facility]  UMBILICAL OR INGUINAL HERNIA REPAIR: POST OP INSTRUCTIONS  Always review your discharge instruction sheet given to you by the facility where your surgery was performed. IF YOU HAVE DISABILITY OR FAMILY LEAVE FORMS, YOU MUST BRING THEM TO THE OFFICE FOR PROCESSING.   DO NOT GIVE THEM TO YOUR DOCTOR.  1. A  prescription for pain medication may be given to you upon discharge.  Take your pain medication as prescribed, if needed.  If narcotic pain medicine is not needed, then you may take acetaminophen (Tylenol) or ibuprofen (Advil) as needed. 2. Take your usually prescribed medications unless otherwise directed. 3. If you need a refill on your pain medication, please contact your pharmacy.  They will contact our office to request authorization. Prescriptions will not be filled after 5 pm or on week-ends. 4. You should follow a light diet the first 24 hours after arrival home, such as soup and crackers, etc.  Be sure to include lots of fluids daily.  Resume your normal diet the day after surgery. 5. Most patients will experience some swelling and bruising around the umbilicus or in the groin and scrotum.  Ice packs and reclining will help.  Swelling and bruising can take several days to resolve.  6. It is common to experience some constipation if taking pain medication after surgery.  Increasing fluid intake and taking a stool softener (such as Colace) will usually help or prevent this problem from occurring.  A mild laxative (Milk of Magnesia or Miralax) should be taken according to package directions if there are no bowel movements after 48 hours. 7. Unless discharge instructions indicate otherwise, you may remove your bandages 24-48 hours after surgery, and you may shower at that time.  You may have steri-strips (small skin tapes) in place directly over the incision.  These strips should be left on the skin for 7-10 days.  If your surgeon used skin glue on the  incision, you may shower in 24 hours.  The glue will flake off over the next 2-3 weeks.  Any sutures or staples will be removed at the office during your follow-up visit. 8. ACTIVITIES:  You may resume regular (light) daily activities beginning the next day--such as daily self-care, walking, climbing stairs--gradually increasing activities as tolerated.  You may have sexual intercourse when it is comfortable.  Refrain from any heavy lifting or straining until approved by your doctor. a. You may drive when you are no longer taking prescription pain medication, you can comfortably wear a seatbelt, and you can safely maneuver your car and apply brakes. b. RETURN TO WORK:  __________________________________________________________ 9. You should see your doctor in the office for a follow-up appointment approximately 2-3 weeks after your surgery.  Make sure that you call for this appointment within a day or two after you arrive home to insure a convenient appointment time. 10. OTHER INSTRUCTIONS: NO LIFTING MORE THAN 15 TO 20 POUNDS FOR 4 WEEKS 11. ICE PACK ALSO FOR PAIN __________________________________________________________________________________________________________________________________________________________________________________________  WHEN TO CALL YOUR DOCTOR: 1. Fever over 101.0 2. Inability to urinate 3. Nausea and/or vomiting 4. Extreme swelling or bruising 5. Continued bleeding from incision. 6. Increased pain, redness, or drainage from the incision  The clinic staff is available to answer your questions during regular business hours.  Please dont hesitate to call and ask to speak to one of the nurses for clinical concerns.  If you have a medical emergency, go to the nearest emergency room or call 911.  A surgeon from [facility] is always on call at the hospital   [address], [place], [place], [place]  [address] ?  [address] [place], [place], [place] [phone] ? [phone] ? FAX [phone] Web site: [email or web address]   Post Anesthesia Home Care Instructions  Activity: Get plenty of rest for the remainder of the day. A responsible adult should stay with you for 24 hours following the procedure.  For the next 24 hours, DO NOT: -Drive a car -Paediatric nurse -Drink alcoholic beverages -Take any medication unless instructed by your physician -Make any legal decisions or sign important papers.  Meals: Start with liquid foods such as gelatin or soup. Progress to regular foods as tolerated. Avoid greasy, spicy, heavy foods. If nausea and/or vomiting occur, drink only clear liquids until the nausea and/or vomiting subsides. Call your physician if vomiting continues.  Special Instructions/Symptoms: Your throat may feel dry or sore from the anesthesia or the breathing tube placed in your throat during surgery. If this causes discomfort, gargle with warm salt water. The discomfort should disappear within 24 hours.  If you had a scopolamine patch placed behind your ear for the management of post- operative nausea and/or vomiting:  1. The medication in the patch is effective for 72 hours, after which it should be removed.  Wrap patch in a tissue and discard in the trash. Wash hands thoroughly with soap and water. 2. You may remove the patch earlier than 72 hours if you experience unpleasant side effects which may include dry mouth, dizziness or visual disturbances. 3. Avoid touching the patch. Wash your hands with soap and water after contact with the patch.

## 2015-08-06 NOTE — Anesthesia Procedure Notes (Addendum)
Anesthesia Regional Block:  TAP block  Pre-Anesthetic Checklist: ,, timeout performed, Correct Patient, Correct Site, Correct Laterality, Correct Procedure, Correct Position, site marked, Risks and benefits discussed,  Surgical consent,  Pre-op evaluation,  At surgeon's request and post-op pain management  Laterality: Right  Prep: chloraprep       Needles:  Injection technique: Single-shot  Needle Type: Echogenic Needle     Needle Length: 9cm 9 cm Needle Gauge: 21 and 21 G    Additional Needles:  Procedures: ultrasound guided (picture in chart) TAP block Narrative:  Start time: 08/06/2015 12:21 PM End time: 08/06/2015 12:28 PM Injection made incrementally with aspirations every 5 mL.  Performed by: Personally  Anesthesiologist: HODIERNE, ADAM  Additional Notes: Pt tolerated the procedure well.   Procedure Name: LMA Insertion Date/Time: 08/06/2015 12:47 PM Performed by: Nilza Eaker D Pre-anesthesia Checklist: Patient identified, Emergency Drugs available, Suction available and Patient being monitored Patient Re-evaluated:Patient Re-evaluated prior to inductionOxygen Delivery Method: Circle System Utilized Preoxygenation: Pre-oxygenation with 100% oxygen Intubation Type: IV induction Ventilation: Mask ventilation without difficulty LMA: LMA inserted LMA Size: 4.0 Number of attempts: 1 Airway Equipment and Method: Bite block Placement Confirmation: positive ETCO2 Tube secured with: Tape Dental Injury: Teeth and Oropharynx as per pre-operative assessment

## 2015-08-06 NOTE — Transfer of Care (Signed)
Immediate Anesthesia Transfer of Care Note  Patient: Dennis Harper  Procedure(s) Performed: Procedure(s): RIGHT INGUINAL HERNIA REPAIR WITH MESH (Right) INSERTION OF MESH (Right)  Patient Location: PACU  Anesthesia Type:GA combined with regional for post-op pain  Level of Consciousness: awake and patient cooperative  Airway & Oxygen Therapy: Patient Spontanous Breathing and Patient connected to face mask oxygen  Post-op Assessment: Report given to RN and Post -op Vital signs reviewed and stable  Post vital signs: Reviewed and stable  Last Vitals:  Filed Vitals:   08/06/15 1215 08/06/15 1230  BP: 120/70 136/85  Pulse: 69 79  Temp:    Resp:  13    Complications: No apparent anesthesia complications

## 2015-08-06 NOTE — Interval H&P Note (Signed)
History and Physical Interval Note: no change in H and P  08/06/2015 12:26 PM  Dennis Harper  has presented today for surgery, with the diagnosis of Right inguinal hernia  The various methods of treatment have been discussed with the patient and family. After consideration of risks, benefits and other options for treatment, the patient has consented to  Procedure(s): RIGHT INGUINAL HERNIA REPAIR WITH MESH (Right) INSERTION OF MESH (Right) as a surgical intervention .  [name] history has been reviewed, patient examined, no change in status, stable for surgery.  I have reviewed the patient's chart and labs.  Questions were answered to the patient's satisfaction.     Isabela Nardelli A

## 2015-08-06 NOTE — Progress Notes (Signed)
AssistedDr. Hodierne with right, ultrasound guided, transabdominal plane block. Side rails up, monitors on throughout procedure. See vital signs in flow sheet. Tolerated Procedure well. ° °

## 2015-08-07 ENCOUNTER — Encounter (HOSPITAL_BASED_OUTPATIENT_CLINIC_OR_DEPARTMENT_OTHER): Payer: Self-pay | Admitting: Surgery

## 2015-08-25 ENCOUNTER — Ambulatory Visit: Payer: Medicare Other | Admitting: Family Medicine

## 2015-12-10 ENCOUNTER — Telehealth: Payer: Self-pay | Admitting: Family Medicine

## 2015-12-10 NOTE — Telephone Encounter (Addendum)
Pt wife would like dr burchette to call her back concerning her husband memory issues. Wife is on [place]

## 2015-12-11 NOTE — Telephone Encounter (Signed)
Looks like we addressed this in September 2016. Dr. Elease Hashimoto is going to want them to come and discuss plus he is past due for a follow up. Can we schedule them a Follow Up on Memory Issues? When he follows up we will do memory testing.

## 2015-12-11 NOTE — Telephone Encounter (Signed)
Pt has been sch for cpx instead of follow up per wife

## 2015-12-30 ENCOUNTER — Encounter: Payer: Self-pay | Admitting: Family Medicine

## 2015-12-30 ENCOUNTER — Ambulatory Visit: Payer: Medicare Other | Admitting: Family Medicine

## 2015-12-30 ENCOUNTER — Ambulatory Visit (INDEPENDENT_AMBULATORY_CARE_PROVIDER_SITE_OTHER): Payer: Medicare Other | Admitting: Family Medicine

## 2015-12-30 VITALS — BP 120/70 | HR 93 | Temp 98.2°F | Ht 70.0 in | Wt 162.1 lb

## 2015-12-30 DIAGNOSIS — Z0000 Encounter for general adult medical examination without abnormal findings: Secondary | ICD-10-CM | POA: Diagnosis not present

## 2015-12-30 DIAGNOSIS — R413 Other amnesia: Secondary | ICD-10-CM | POA: Diagnosis not present

## 2015-12-30 DIAGNOSIS — Z23 Encounter for immunization: Secondary | ICD-10-CM | POA: Diagnosis not present

## 2015-12-30 NOTE — Progress Notes (Signed)
Subjective:     Patient ID: Dennis Harper, male   DOB: 1941-02-07, 75 y.o.   MRN: [id number]  HPI Patient seen for  Medicare subsequent annual wellness visit and medical follow-up Short-term memory loss. March 2016 MMSE 26/30. Saw neurologist last fall but refused further testing at that point. Recent B12 and TSH normal. Currently takes Namenda 10 mg twice daily. Takes over-the-counter B12 supplement. Patient is retired. No complaints. Still smokes about 6 cigarettes per day  Past Medical History  Diagnosis Date  . GALLSTONES 01/06/2010    Qualifier: Diagnosis of  By: Elease Hashimoto MD, Jp Eastham    . 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 GERD (gastroesophageal reflux disease)    Past Surgical History  Procedure Laterality Date  . Appendectomy      age 57  . Inguinal hernia repair Right 08/06/2015    Procedure: RIGHT INGUINAL HERNIA REPAIR WITH MESH;  Surgeon: Coralie Keens, MD;  Location: [place];  Service: General;  Laterality: Right;  . Insertion of mesh Right 08/06/2015    Procedure: INSERTION OF MESH;  Surgeon: Coralie Keens, MD;  Location: [facility];  Service: General;  Laterality: Right;    reports that he has been smoking Cigars.  He has never used smokeless tobacco. He reports that he drinks alcohol. He reports that he does not use illicit drugs. family history is negative for Alcohol abuse and Heart disease. No Known Allergies  1.  Risk factors based on Past Medical , Social, and Family history reviewed and as indicated above with no changes 2.  Limitations in physical activities None.  No recent falls. 3.  Depression/mood No active depression or anxiety issues 4.  Hearing bilateral hearing aids. 5.  ADLs independent in all. 6.  Cognitive function (orientation to time and place, language, writing, speech,memory)  MMSE 21/30 0/3 three word recall. 7.  Home Safety no issues 8.  Height,  weight, and visual acuity.all stable. 9.  Counseling discussed smoking cessation. 10. Recommendation of preventive services. Prevnar 13 11. Labs based on risk factors none 12. Care Plan as above.  13. Other Providers none. 14. Written schedule of screening/prevention services given to patient.   Review of Systems  Constitutional: Negative for fever, activity change, appetite change and fatigue.  HENT: Negative for congestion, ear pain and trouble swallowing.   Eyes: Negative for pain and visual disturbance.  Respiratory: Negative for cough, shortness of breath and wheezing.   Cardiovascular: Negative for chest pain and palpitations.  Gastrointestinal: Negative for nausea, vomiting, abdominal pain, diarrhea, constipation, blood in stool, abdominal distention and rectal pain.  Genitourinary: Negative for dysuria, hematuria and testicular pain.  Musculoskeletal: Negative for joint swelling and arthralgias.  Skin: Negative for rash.  Neurological: Negative for dizziness, syncope and headaches.  Hematological: Negative for adenopathy.  Psychiatric/Behavioral: Negative for confusion and dysphoric mood.       Objective:   Physical Exam  Constitutional: He is oriented to person, place, and time. He appears well-developed and well-nourished. No distress.  HENT:  Head: Normocephalic and atraumatic.  Right Ear: External ear normal.  Left Ear: External ear normal.  Mouth/Throat: Oropharynx is clear and moist.  Eyes: Conjunctivae and EOM are normal. Pupils are equal, round, and reactive to light.  Neck: Normal range of motion. Neck supple. No thyromegaly present.  Cardiovascular: Normal rate, regular rhythm and normal heart sounds.   No murmur heard. Pulmonary/Chest: No respiratory  distress. He has no wheezes. He has no rales.  Abdominal: Soft. Bowel sounds are normal. He exhibits no distension and no mass. There is no tenderness. There is no rebound and no guarding.  Musculoskeletal: He  exhibits no edema.  Lymphadenopathy:    He has no cervical adenopathy.  Neurological: He is alert and oriented to person, place, and time. He displays normal reflexes. No cranial nerve deficit.  Skin: No rash noted.  Psychiatric: He has a normal mood and affect.  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    Plan:     - Prevnar 13 given  -continue yearly flu vaccine - patient encouraged stop smoking - will discuss with wife possible treatment options for his cognitive impairment. Consider addition of Aricept. -continue with Namenda.  -discussed Advanced directives.  Pt thinks he has living will and he will confirm.  Eulas Post MD [facility]

## 2015-12-30 NOTE — Progress Notes (Signed)
Pre visit review using our clinic review tool, if applicable. No additional management support is needed unless otherwise documented below in the visit note. 

## 2015-12-30 NOTE — Patient Instructions (Signed)
Health Maintenance  Topic Date Due  . PNA vac Low Risk Adult (2 of 2 - PCV13) 01/07/2011  . INFLUENZA VACCINE  04/14/2016 (Originally 01/13/2016)  . ZOSTAVAX  12/29/2016 (Originally 07/19/1999)  . TETANUS/TDAP  01/11/2022

## 2016-04-06 DIAGNOSIS — H3530 Unspecified macular degeneration: Secondary | ICD-10-CM | POA: Diagnosis not present

## 2016-06-16 DIAGNOSIS — L820 Inflamed seborrheic keratosis: Secondary | ICD-10-CM | POA: Diagnosis not present

## 2016-06-16 DIAGNOSIS — L300 Nummular dermatitis: Secondary | ICD-10-CM | POA: Diagnosis not present

## 2016-06-16 DIAGNOSIS — D485 Neoplasm of uncertain behavior of skin: Secondary | ICD-10-CM | POA: Diagnosis not present

## 2016-08-03 ENCOUNTER — Telehealth: Payer: Self-pay | Admitting: Family Medicine

## 2016-08-03 NOTE — Telephone Encounter (Signed)
Last CPE was 12-30-2015. Okay to order CPE labs and schedule a lab appt on this patient or need OV?

## 2016-08-03 NOTE — Telephone Encounter (Signed)
Cannot order labs until pt is seen, can schedule it as a "7 month follow Up Review Medications"---make sure wife comes with pt to address concerns. Thanks.

## 2016-08-03 NOTE — Telephone Encounter (Signed)
Recommend office follow up and, if possible, for wife to come with him.

## 2016-08-03 NOTE — Telephone Encounter (Signed)
Pt's spouse wants to know why labs were not performed at last CPE.  I advised pt that it was performed as an AWV.  She stated that she had specifically asked for labs to be done and they were not.  She would like to speak to Dr. Elease Hashimoto personally.  The pt has been having really bad spells of dizziness and he is coming in tomorrow morning for a visit.  The spouse is asking that labs be done then.  She also stated that she doesn't care if the insurance doesn't pay, that they will pay for it.

## 2016-08-04 ENCOUNTER — Encounter: Payer: Self-pay | Admitting: Family Medicine

## 2016-08-04 ENCOUNTER — Ambulatory Visit (INDEPENDENT_AMBULATORY_CARE_PROVIDER_SITE_OTHER): Payer: Medicare Other | Admitting: Family Medicine

## 2016-08-04 VITALS — BP 110/62 | HR 77 | Ht 70.0 in | Wt 165.0 lb

## 2016-08-04 DIAGNOSIS — R42 Dizziness and giddiness: Secondary | ICD-10-CM

## 2016-08-04 DIAGNOSIS — R4189 Other symptoms and signs involving cognitive functions and awareness: Secondary | ICD-10-CM | POA: Diagnosis not present

## 2016-08-04 DIAGNOSIS — R5383 Other fatigue: Secondary | ICD-10-CM | POA: Diagnosis not present

## 2016-08-04 LAB — BASIC METABOLIC PANEL
BUN: 11 mg/dL (ref 6–23)
CHLORIDE: 106 meq/L (ref 96–112)
CO2: 32 mEq/L (ref 19–32)
Calcium: 9 mg/dL (ref 8.4–10.5)
Creatinine, Ser: 0.93 mg/dL (ref 0.40–1.50)
GFR: 83.73 mL/min (ref >60.00)
Glucose, Bld: 105 mg/dL — ABNORMAL HIGH (ref 70–99)
POTASSIUM: 4 meq/L (ref 3.5–5.1)
SODIUM: 141 meq/L (ref 135–145)

## 2016-08-04 LAB — CBC WITH DIFFERENTIAL/PLATELET
BASOS PCT: 0.6 % (ref 0.0–3.0)
Basophils Absolute: 0 10*3/uL (ref 0.0–0.1)
EOS PCT: 0.5 % (ref 0.0–5.0)
Eosinophils Absolute: 0 10*3/uL (ref 0.0–0.7)
HCT: 40.7 % (ref 39.0–52.0)
HEMOGLOBIN: 13.7 g/dL (ref 13.0–17.0)
LYMPHS ABS: 1.9 10*3/uL (ref 0.7–4.0)
Lymphocytes Relative: 22.3 % (ref 12.0–46.0)
MCHC: 33.7 g/dL (ref 30.0–36.0)
MCV: 90.4 fl (ref 78.0–100.0)
MONO ABS: 0.4 10*3/uL (ref 0.1–1.0)
MONOS PCT: 4.9 % (ref 3.0–12.0)
NEUTROS PCT: 71.7 % (ref 43.0–77.0)
Neutro Abs: 6.2 10*3/uL (ref 1.4–7.7)
Platelets: 255 10*3/uL (ref 150.0–400.0)
RBC: 4.51 Mil/uL (ref 4.22–5.81)
RDW: 13.7 % (ref 11.5–15.5)
WBC: 8.7 10*3/uL (ref 4.0–10.5)

## 2016-08-04 LAB — HEPATIC FUNCTION PANEL
ALBUMIN: 4 g/dL (ref 3.5–5.2)
ALK PHOS: 49 U/L (ref 39–117)
ALT: 10 U/L (ref 0–53)
AST: 14 U/L (ref 0–37)
BILIRUBIN TOTAL: 0.5 mg/dL (ref 0.2–1.2)
Bilirubin, Direct: 0.1 mg/dL (ref 0.0–0.3)
Total Protein: 6.6 g/dL (ref 6.0–8.3)

## 2016-08-04 LAB — TSH: TSH: 2.11 u[IU]/mL (ref 0.35–4.50)

## 2016-08-04 LAB — VITAMIN B12: Vitamin B-12: 335 pg/mL (ref 211–911)

## 2016-08-04 NOTE — Progress Notes (Signed)
Subjective:     Patient ID: Dennis Harper, male   DOB: August 03, 1940, 76 y.o.   MRN: [id number]  HPI Has cognitive impairment and we received a call yesterday from his wife that she was concerned he's had some recent lightheadedness and dizziness. No reported syncope. We had strongly advised that she come with him as history is becoming more limited because of his memory loss. He does relate some occasional lightheadedness and is not sure if this is positional. He denies any appetite or weight changes. No fevers or chills. No chest pains. No dyspnea. No focal weakness. No headaches. No recent falls.  He is supposed to be on Namenda 10 mg twice daily but not clear if he is taking this at all. He had TSH and B12 which were normal little over year ago. No recent lab work otherwise. He does mention some mild fatigue  Past Medical History:  Diagnosis Date  . GALLSTONES 01/06/2010   Qualifier: Diagnosis of  By: Elease Hashimoto MD, Bruce    . GERD (gastroesophageal reflux disease)   . 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 APPENDECTOMY     age 66  . INGUINAL HERNIA REPAIR Right 08/06/2015   Procedure: RIGHT INGUINAL HERNIA REPAIR WITH MESH;  Surgeon: Coralie Keens, MD;  Location: [place] [facility];  Service: General;  Laterality: Right;  . INSERTION OF MESH Right 08/06/2015   Procedure: INSERTION OF MESH;  Surgeon: Coralie Keens, MD;  Location: [place];  Service: General;  Laterality: Right;    reports that he has been smoking Cigars.  He has never used smokeless tobacco. He reports that he drinks alcohol. He reports that he does not use drugs. family history is not on file. No Known Allergies   Review of Systems  Constitutional: Negative for fatigue.  Eyes: Negative for visual disturbance.  Respiratory: Negative for cough, chest tightness and shortness of breath.    Cardiovascular: Negative for chest pain, palpitations and leg swelling.  Gastrointestinal: Negative for abdominal pain.  Endocrine: Negative for polydipsia and polyuria.  Genitourinary: Negative for dysuria.  Neurological: Positive for dizziness and light-headedness. Negative for syncope, weakness and headaches.       Objective:   Physical Exam  Constitutional: He is oriented to person, place, and time. He appears well-developed and well-nourished. No distress.  HENT:  Head: Normocephalic and atraumatic.  Right Ear: External ear normal.  Left Ear: External ear normal.  Mouth/Throat: Oropharynx is clear and moist.  Eyes: Conjunctivae and EOM are normal. Pupils are equal, round, and reactive to light.  Neck: Normal range of motion. Neck supple. No thyromegaly present.  Cardiovascular: Normal rate, regular rhythm and normal heart sounds.   No murmur heard. Pulmonary/Chest: No respiratory distress. He has no wheezes. He has no rales.  Abdominal: Soft. Bowel sounds are normal. He exhibits no distension and no mass. There is no tenderness. There is no rebound and no guarding.  Musculoskeletal: He exhibits no edema.  Lymphadenopathy:    He has no cervical adenopathy.  Neurological: He is alert and oriented to person, place, and time. He displays normal reflexes. No cranial nerve deficit.  Skin: No rash noted.  Psychiatric: He has a normal mood and affect.       Assessment:     Patient has cognitive impairment and presents alone today with recent reports of some mild intermittent lightheadedness.  History is very difficult from patient. He has nonfocal exam. Standing blood pressure 110/60 which is unchanged from sitting blood pressure    Plan:     -Labs - B12, TSH, CBC, basic metabolic panel, hepatic panel -Get back on Namenda 10 mg twice daily -We'll discuss with wife and we need to make sure she accompany him if possible with future visits -We'll plan three-month follow-up and  repeat MMSE at that time  Eulas Post MD [facility] at [facility]

## 2016-08-04 NOTE — Progress Notes (Signed)
Pre visit review using our clinic review tool, if applicable. No additional management support is needed unless otherwise documented below in the visit note. 

## 2016-08-04 NOTE — Patient Instructions (Signed)
Fall Prevention in the Home Falls can cause injuries and can affect people from all age groups. There are many simple things that you can do to make your home safe and to help prevent falls. What can I do on the outside of my home?  Regularly repair the edges of walkways and driveways and fix any cracks.  Remove high doorway thresholds.  Trim any shrubbery on the main path into your home.  Use bright outdoor lighting.  Clear walkways of debris and clutter, including tools and rocks.  Regularly check that handrails are securely fastened and in good repair. Both sides of any steps should have handrails.  Install guardrails along the edges of any raised decks or porches.  Have leaves, snow, and ice cleared regularly.  Use sand or salt on walkways during winter months.  In the garage, clean up any spills right away, including grease or oil spills. What can I do in the bathroom?  Use night lights.  Install grab bars by the toilet and in the tub and shower. Do not use towel bars as grab bars.  Use non-skid mats or decals on the floor of the tub or shower.  If you need to sit down while you are in the shower, use a plastic, non-slip stool.  Keep the floor dry. Immediately clean up any water that spills on the floor.  Remove soap buildup in the tub or shower on a regular basis.  Attach bath mats securely with double-sided non-slip rug tape.  Remove throw rugs and other tripping hazards from the floor. What can I do in the bedroom?  Use night lights.  Make sure that a bedside light is easy to reach.  Do not use oversized bedding that drapes onto the floor.  Have a firm chair that has side arms to use for getting dressed.  Remove throw rugs and other tripping hazards from the floor. What can I do in the kitchen?  Clean up any spills right away.  Avoid walking on wet floors.  Place frequently used items in easy-to-reach places.  If you need to reach for something above  you, use a sturdy step stool that has a grab bar.  Keep electrical cables out of the way.  Do not use floor polish or wax that makes floors slippery. If you have to use wax, make sure that it is non-skid floor wax.  Remove throw rugs and other tripping hazards from the floor. What can I do in the stairways?  Do not leave any items on the stairs.  Make sure that there are handrails on both sides of the stairs. Fix handrails that are broken or loose. Make sure that handrails are as long as the stairways.  Check any carpeting to make sure that it is firmly attached to the stairs. Fix any carpet that is loose or worn.  Avoid having throw rugs at the top or bottom of stairways, or secure the rugs with carpet tape to prevent them from moving.  Make sure that you have a light switch at the top of the stairs and the bottom of the stairs. If you do not have them, have them installed. What are some other fall prevention tips?  Wear closed-toe shoes that fit well and support your feet. Wear shoes that have rubber soles or low heels.  When you use a stepladder, make sure that it is completely opened and that the sides are firmly locked. Have someone hold the ladder while you are using   it. Do not climb a closed stepladder.  Add color or contrast paint or tape to grab bars and handrails in your home. Place contrasting color strips on the first and last steps.  Use mobility aids as needed, such as canes, walkers, scooters, and crutches.  Turn on lights if it is dark. Replace any light bulbs that burn out.  Set up furniture so that there are clear paths. Keep the furniture in the same spot.  Fix any uneven floor surfaces.  Choose a carpet design that does not hide the edge of steps of a stairway.  Be aware of any and all pets.  Review your medicines with your healthcare provider. Some medicines can cause dizziness or changes in blood pressure, which increase your risk of falling. Talk with  your health care provider about other ways that you can decrease your risk of falls. This may include working with a physical therapist or trainer to improve your strength, balance, and endurance. This information is not intended to replace advice given to you by your health care provider. Make sure you discuss any questions you have with your health care provider. Document Released: 05/21/2002 Document Revised: 10/28/2015 Document Reviewed: 07/05/2014 Elsevier Interactive Patient Education  2017 [employer].  Make sure you are taking your Namenda twice daily We will call you with labs drawn today.

## 2016-08-05 NOTE — Telephone Encounter (Signed)
Pt has been scheduled.  °

## 2016-10-25 ENCOUNTER — Ambulatory Visit: Payer: Medicare Other | Admitting: Family Medicine

## 2016-11-01 ENCOUNTER — Ambulatory Visit: Payer: Medicare Other | Admitting: Family Medicine

## 2017-03-29 DIAGNOSIS — Z23 Encounter for immunization: Secondary | ICD-10-CM | POA: Diagnosis not present

## 2017-10-11 ENCOUNTER — Ambulatory Visit (INDEPENDENT_AMBULATORY_CARE_PROVIDER_SITE_OTHER): Payer: Medicare Other | Admitting: Family Medicine

## 2017-10-11 DIAGNOSIS — R413 Other amnesia: Secondary | ICD-10-CM

## 2017-10-11 MED ORDER — DONEPEZIL HCL 5 MG PO TABS: 5.0000 mg | ORAL_TABLET | Freq: Every day | ORAL | 0 refills | Status: DC

## 2017-10-11 MED ORDER — DONEPEZIL HCL 10 MG PO TABS: 10.0000 mg | ORAL_TABLET | Freq: Every day | ORAL | 5 refills | Status: DC

## 2017-10-11 NOTE — Progress Notes (Signed)
Subjective:     Patient ID: Dennis Harper, male   DOB: February 12, 1941, 77 y.o.   MRN: [id number]  HPI Patient seen coming by wife with concerns for memory problems. He's actually had memory problem going back at least a few years. He had MMSE score of 26/30 back in March 2016. We had previously recommended Namenda- but he apparently never took.  He was referred to neurology and refused further evaluation back in 2016.  He's not taking any medications currently. Wife states that she has seen some progression in short term memory issues. For example, he went to get gas the other day and came back and never got any. Seems to definitely have problems with short-term memory. No headaches. No recent falls. No head injury. No obvious visuospatial problems and no hallucinations.  He has an older brother that was diagnosed about 6 months ago with Lewy body dementia. Wife has never seen any delusional type behavior or any evidence for hallucinations  Previous B12 and TSH levels normal. No history of seizure. No history of focal neurologic deficits  Past Medical History:  Diagnosis Date  . GALLSTONES 01/06/2010   Qualifier: Diagnosis of  By: Elease Hashimoto MD, Bruce    . GERD (gastroesophageal reflux disease)   . 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 APPENDECTOMY     age 38  . INGUINAL HERNIA REPAIR Right 08/06/2015   Procedure: RIGHT INGUINAL HERNIA REPAIR WITH MESH;  Surgeon: Coralie Keens, MD;  Location: [place];  Service: General;  Laterality: Right;  . INSERTION OF MESH Right 08/06/2015   Procedure: INSERTION OF MESH;  Surgeon: Coralie Keens, MD;  Location: [place];  Service: General;  Laterality: Right;    reports that he has been smoking cigars.  He has never used smokeless tobacco. He reports that he drinks alcohol. He reports that he does not use  drugs. family history is not on file. No Known Allergies   Review of Systems  Constitutional: Negative for appetite change and unexpected weight change.  Respiratory: Negative for shortness of breath.   Cardiovascular: Negative for chest pain.  Gastrointestinal: Negative for abdominal pain.  Genitourinary: Negative for dysuria.  Neurological: Negative for dizziness, seizures, syncope, weakness and headaches.  Psychiatric/Behavioral: Positive for confusion. Negative for agitation.       Objective:   Physical Exam  Constitutional: He appears well-developed and well-nourished.  HENT:  Head: Normocephalic and atraumatic.  Eyes: Pupils are equal, round, and reactive to light.  Cardiovascular: Normal rate and regular rhythm.  Pulmonary/Chest: Effort normal and breath sounds normal. No respiratory distress.  Neurological: He is alert. No cranial nerve deficit. Coordination normal.  No focal strength deficits. Gait normal  Psychiatric:  MMSE 15/30       Assessment:     Progressive memory loss. 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 He's had substantial decline from a few years ago from 26/30-15/30.    Plan:     -Discuss several things including our concern for safety issues with things like driving. -We discussed options for neurology referral and at this point they wish to wait -We discussed that medications may have very limited effect at this point especially with scores above, but they would like to try Aricept. We did agree to starting Aricept 5 mg daily for 1 month and then titrate to 10 mg and reassess in 2 months  Bruce  Dan Europe MD [facility] at [facility]

## 2017-10-11 NOTE — Patient Instructions (Addendum)
Management of Memory Problems  There are some general things you can do to help manage your memory problems.  Your memory may not in fact recover, but by using techniques and strategies you will be able to manage your memory difficulties better.  1)  Establish a routine.  Try to establish and then stick to a regular routine.  By doing this, you will get used to what to expect and you will reduce the need to rely on your memory.  Also, try to do things at the same time of day, such as taking your medication or checking your calendar first thing in the morning.  Think about think that you can do as a part of a regular routine and make a list.  Then enter them into a daily planner to remind you.  This will help you establish a routine.  2)  Organize your environment.  Organize your environment so that it is uncluttered.  Decrease visual stimulation.  Place everyday items such as keys or cell phone in the same place every day (ie.  Basket next to front door)  Use post it notes with a brief message to yourself (ie. Turn off light, lock the door)  Use labels to indicate where things go (ie. Which cupboards are for food, dishes, etc.)  Keep a notepad and pen by the telephone to take messages  3)  Memory Aids  A diary or journal/notebook/daily planner  Making a list (shopping list, chore list, to do list that needs to be done)  Using an alarm as a reminder (kitchen timer or cell phone alarm)  Using cell phone to store information (Notes, Calendar, Reminders)  Calendar/White board placed in a prominent position  Post-it notes  In order for memory aids to be useful, you need to have good habits.  It's no good remembering to make a note in your journal if you don't remember to look in it.  Try setting aside a certain time of day to look in journal.  4)  Improving mood and managing fatigue.  There may be other factors that contribute to memory difficulties.  Factors, such as anxiety,  depression and tiredness can affect memory.  Regular gentle exercise can help improve your mood and give you more energy.  Simple relaxation techniques may help relieve symptoms of anxiety  Try to get back to completing activities or hobbies you enjoyed doing in the past.  Learn to pace yourself through activities to decrease fatigue.  Find out about some local support groups where you can share experiences with others.  Try and achieve 7-8 hours of sleep at night.  Memory Compensation Strategies  2. Use "WARM" strategy.  W= write it down  A= associate it  R= repeat it  M= make a mental note  2.   You can keep a Memory Notebook.  Use a 3-ring notebook with sections for the following: calendar, important names and phone numbers,  medications, doctors' names/phone numbers, lists/reminders, and a section to journal what you did  each day.   3.    Use a calendar to write appointments down.  4.    Write yourself a schedule for the day.  This can be placed on the calendar or in a separate section of the Memory Notebook.  Keeping a  regular schedule can help memory.  5.    Use medication organizer with sections for each day or morning/evening pills.  You may need help loading it  6.      or pegboard by the door.  Place items that you need to take out with you in the basket or on the pegboard.  You may also want to  include a message board for reminders.  7.    Use sticky notes.  Place sticky notes with reminders in a place where the task is performed.  For example: " turn off the  stove" placed by the stove, "lock the door" placed on the door at eye level, " take your medications" on  the bathroom mirror or by the place where you normally take your medications.  8.    Use alarms/timers.  Use while cooking to remind yourself to check on food or as a reminder to take your medicine, or as a  reminder to make a call, or as a reminder to perform another task, etc.  Start the  Aricept at 5 mg once daily and then increase after one month to 10 mg

## 2017-11-03 ENCOUNTER — Other Ambulatory Visit: Payer: Self-pay | Admitting: Family Medicine

## 2017-11-04 MED ORDER — DONEPEZIL HCL 10 MG PO TABS: 10.0000 mg | ORAL_TABLET | Freq: Every day | ORAL | 11 refills | Status: DC

## 2017-11-04 NOTE — Telephone Encounter (Signed)
I left a detailed message with the information below at the pts home number.  Rx for Aricept 10mg  sent to the pts pharmacy and 5mg  Rx was denied.

## 2017-11-04 NOTE — Telephone Encounter (Signed)
I recommend that we go ahead and bump up his Aricept dose to 10 mg po qd and refill for one year.

## 2018-04-12 ENCOUNTER — Telehealth: Payer: Self-pay | Admitting: Family Medicine

## 2018-04-12 NOTE — Telephone Encounter (Signed)
Copied from [place] [phone]. Topic: Quick Communication - See Telephone Encounter >> Apr 12, 2018 10:47 AM Bea Graff, NT wrote: CRM for notification. See Telephone encounter for: 04/12/18. Pts wife calling to request a call from Dr. Elease Hashimoto to discuss her husbands dementia and where she should take him to be evaluated. She states the dementia is getting worse. Please advise.

## 2018-04-12 NOTE — Telephone Encounter (Signed)
I called his wife and spoke with her.  Suggested geriatric/memory care clinic at [facility].  We discussed safety issues with driving.  We have recommended that they keep car keys away from him.

## 2018-04-12 NOTE — Telephone Encounter (Signed)
Last OV 10/11/17, No future OV

## 2018-04-19 DIAGNOSIS — Z23 Encounter for immunization: Secondary | ICD-10-CM | POA: Diagnosis not present

## 2018-06-20 ENCOUNTER — Telehealth: Payer: Self-pay

## 2018-06-20 NOTE — Telephone Encounter (Signed)
Called patient and spoke to his wife and she stated that Mr. Dennis Harper dementia is getting worse and he thinks people are there and they are not. Patient has an appointment with Dr. Elease Hashimoto on Friday, 1/10. Wife states he is still very kind and nice but he thinks his parents are still living and wants to go visit them even though they passed away years ago. Wife states that he seems to get worse in the evenings. Wife just wants Dr. Elease Hashimoto to be aware before his appointment. Patient has also forgotten his wife and did not recognize her. Patient is also getting dizzy and feeling weak and has lost about 30 lbs and he is always cold.  Copied from [place] [phone]. Topic: General - Other >> Jun 20, 2018 10:51 AM Leward Quan A wrote: Reason for CRM: Bridgett Larsson patient's wife called to request a call back from Dr Anastasio Auerbach assistant she stated that she has some information for the doctor. She did not elaborate on what it was. Cb# [phone]

## 2018-06-20 NOTE — Telephone Encounter (Signed)
Noted.  I will try to take her aside and speak with her independently when he comes in.

## 2018-06-23 ENCOUNTER — Ambulatory Visit (INDEPENDENT_AMBULATORY_CARE_PROVIDER_SITE_OTHER): Payer: Medicare Other | Admitting: Family Medicine

## 2018-06-23 ENCOUNTER — Other Ambulatory Visit: Payer: Self-pay

## 2018-06-23 ENCOUNTER — Encounter: Payer: Self-pay | Admitting: Family Medicine

## 2018-06-23 VITALS — BP 98/58 | HR 75 | Temp 98.2°F | Ht 67.5 in | Wt 147.3 lb

## 2018-06-23 DIAGNOSIS — G309 Alzheimer's disease, unspecified: Secondary | ICD-10-CM | POA: Diagnosis not present

## 2018-06-23 DIAGNOSIS — F0280 Dementia in other diseases classified elsewhere without behavioral disturbance: Secondary | ICD-10-CM | POA: Diagnosis not present

## 2018-06-23 DIAGNOSIS — R634 Abnormal weight loss: Secondary | ICD-10-CM | POA: Diagnosis not present

## 2018-06-23 LAB — BASIC METABOLIC PANEL
BUN: 12 mg/dL (ref 6–23)
CHLORIDE: 104 meq/L (ref 96–112)
CO2: 29 meq/L (ref 19–32)
Calcium: 9.3 mg/dL (ref 8.4–10.5)
Creatinine, Ser: 0.87 mg/dL (ref 0.40–1.50)
GFR: 89.98 mL/min (ref >60.00)
Glucose, Bld: 83 mg/dL (ref 70–99)
Potassium: 4.8 mEq/L (ref 3.5–5.1)
Sodium: 140 mEq/L (ref 135–145)

## 2018-06-23 LAB — CBC WITH DIFFERENTIAL/PLATELET
BASOS PCT: 0.7 % (ref 0.0–3.0)
Basophils Absolute: 0 10*3/uL (ref 0.0–0.1)
EOS ABS: 0 10*3/uL (ref 0.0–0.7)
EOS PCT: 0.4 % (ref 0.0–5.0)
HEMATOCRIT: 39.4 % (ref 39.0–52.0)
Hemoglobin: 13.2 g/dL (ref 13.0–17.0)
LYMPHS PCT: 21.6 % (ref 12.0–46.0)
Lymphs Abs: 1.4 10*3/uL (ref 0.7–4.0)
MCHC: 33.5 g/dL (ref 30.0–36.0)
MCV: 89.6 fl (ref 78.0–100.0)
Monocytes Absolute: 0.4 10*3/uL (ref 0.1–1.0)
Monocytes Relative: 6.5 % (ref 3.0–12.0)
NEUTROS ABS: 4.7 10*3/uL (ref 1.4–7.7)
Neutrophils Relative %: 70.8 % (ref 43.0–77.0)
PLATELETS: 280 10*3/uL (ref 150.0–400.0)
RBC: 4.39 Mil/uL (ref 4.22–5.81)
RDW: 13.9 % (ref 11.5–15.5)
WBC: 6.7 10*3/uL (ref 4.0–10.5)

## 2018-06-23 LAB — HEPATIC FUNCTION PANEL
ALT: 9 U/L (ref 0–53)
AST: 13 U/L (ref 0–37)
Albumin: 4.1 g/dL (ref 3.5–5.2)
Alkaline Phosphatase: 42 U/L (ref 39–117)
BILIRUBIN DIRECT: 0.1 mg/dL (ref 0.0–0.3)
TOTAL PROTEIN: 6.5 g/dL (ref 6.0–8.3)
Total Bilirubin: 0.6 mg/dL (ref 0.2–1.2)

## 2018-06-23 LAB — TSH: TSH: 2.03 u[IU]/mL (ref 0.35–4.50)

## 2018-06-23 NOTE — Patient Instructions (Signed)
Dementia Caregiver Guide Dementia is a term used to describe a number of symptoms that affect memory and thinking. The most common symptoms include:  Memory loss.  Trouble with language and communication.  Trouble concentrating.  Poor judgment.  Problems with reasoning.  Child-like behavior and language.  Extreme anxiety.  Angry outbursts.  Wandering from home or public places. Dementia usually gets worse slowly over time. In the early stages, people with dementia can stay independent and safe with some help. In later stages, they need help with daily tasks such as dressing, grooming, and using the bathroom. How to help the person with dementia cope Dementia can be frightening and confusing. Here are some tips to help the person with dementia cope with changes caused by the disease. General tips  Keep the person on track with his or her routine.  Try to identify areas where the person may need help.  Be supportive, patient, calm, and encouraging.  Gently remind the person that adjusting to changes takes time.  Help with the tasks that the person has asked for help with.  Keep the person involved in daily tasks and decisions as much as possible.  Encourage conversation, but try not to get frustrated or harried if the person struggles to find words or does not seem to appreciate your help. Communication tips  When the person is talking or seems frustrated, make eye contact and hold the person's hand.  Ask specific questions that need yes or no answers.  Use simple words, short sentences, and a calm voice. Only give one direction at a time.  When offering choices, limit them to just 1 or 2.  Avoid correcting the person in a negative way.  If the person is struggling to find the right words, gently try to help him or her. How to recognize symptoms of stress Symptoms of stress in caregivers include:  Feeling frustrated or angry with the person with dementia.   Denying that the person has dementia or that his or her symptoms will not improve.  Feeling hopeless and unappreciated.  Difficulty sleeping.  Difficulty concentrating.  Feeling anxious, irritable, or depressed.  Developing stress-related health problems.  Feeling like you have too little time for your own life. Follow these instructions at home:   Make sure that you and the person you are caring for: ? Get regular sleep. ? Exercise regularly. ? Eat regular, nutritious meals. ? Drink enough fluid to keep your urine clear or pale yellow. ? Take over-the-counter and prescription medicines only as told by your health care providers. ? Attend all scheduled health care appointments.  Join a support group with others who are caregivers.  Ask about respite care resources so that you can have a regular break from the stress of caregiving.  Look for signs of stress in yourself and in the person you are caring for. If you notice signs of stress, take steps to manage it.  Consider any safety risks and take steps to avoid them.  Organize medications in a pill box for each day of the week.  Create a plan to handle any legal or financial matters. Get legal or financial advice if needed.  Keep a calendar in a central location to remind the person of appointments or other activities. Tips for reducing the risk of injury  Keep floors clear of clutter. Remove rugs, magazine racks, and floor lamps.  Keep hallways well lit, especially at night.  Put a handrail and nonslip mat in the bathtub or   shower.  Put childproof locks on cabinets that contain dangerous items, such as medicines, alcohol, guns, toxic cleaning items, sharp tools or utensils, matches, and lighters.  Put the locks in places where the person cannot see or reach them easily. This will help ensure that the person does not wander out of the house and get lost.  Be prepared for emergencies. Keep a list of emergency phone  numbers and addresses in a convenient area.  Remove car keys and lock garage doors so that the person does not try to get in the car and drive.  Have the person wear a bracelet that tracks locations and identifies the person as having memory problems. This should be worn at all times for safety. Where to find support: Many individuals and organizations offer support. These include:  Support groups for people with dementia and for caregivers.  Counselors or therapists.  Home health care services.  Adult day care centers. Where to find more information Alzheimer's Association: www.alz.org Contact a health care provider if:  The person's health is rapidly getting worse.  You are no longer able to care for the person.  Caring for the person is affecting your physical and emotional health.  The person threatens himself or herself, you, or anyone else. Summary  Dementia is a term used to describe a number of symptoms that affect memory and thinking.  Dementia usually gets worse slowly over time.  Take steps to reduce the person's risk of injury, and to plan for future care.  Caregivers need support, relief from caregiving, and time for their own lives. This information is not intended to replace advice given to you by your health care provider. Make sure you discuss any questions you have with your health care provider. Document Released: 05/04/2016 Document Revised: 05/04/2016 Document Reviewed: 05/04/2016 Elsevier Interactive Patient Education  2019 Elsevier Inc.  

## 2018-06-23 NOTE — Progress Notes (Signed)
Subjective:     Patient ID: Dennis Harper, male   DOB: 11-Jul-1940, 78 y.o.   MRN: [id number]  HPI Patient seen for follow-up regarding dementia.  He is accompanied by his wife and son.  He has had progressive memory loss and confusion now for several years.  He is no longer driving.  He is no longer managing their affairs financially.  His wife states that for the most part he seems very cooperative but he is getting more confused.  On a couple of occasions he has not recognized her at night in the middle of the evening.  Even though he seems to be eating fairly regularly they state that he has complained some of lightheadedness and feeling weak and he has lost about 30 pounds over the past couple years.  Also complains of feeling cold frequently.  History very limited from patient.  He has not had any headaches, chest pain, abdominal pain or any change in urine or stool habits.  Vaccine back in October.  Is currently not taking any medications.  He had been on Aricept and Namenda but patient refused to take any medications.  Wife has not seen any agitation.  No wandering behaviors at night Fortunately, he seems to be sleeping fairly well through the night most nights  Patient still smokes a few cigarettes per day.  This is supervised by his wife.  No dyspnea  Patient does have long-term care insurance and they are looking at possible resources available to his wife help with things like part time sitter.  Past Medical History:  Diagnosis Date  . GALLSTONES 01/06/2010   Qualifier: Diagnosis of  By: Elease Hashimoto MD, Tylor Gambrill    . GERD (gastroesophageal reflux disease)   . 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 APPENDECTOMY     age 72  . INGUINAL HERNIA REPAIR Right 08/06/2015   Procedure: RIGHT INGUINAL HERNIA REPAIR WITH MESH;  Surgeon: Coralie Keens, MD;  Location: [facility];  Service: General;  Laterality: Right;  . INSERTION OF MESH Right 08/06/2015   Procedure: INSERTION OF MESH;  Surgeon: Coralie Keens, MD;  Location: [facility];  Service: General;  Laterality: Right;    reports that he has been smoking cigars. He has never used smokeless tobacco. He reports current alcohol use. He reports that he does not use drugs. family history is not on file. No Known Allergies   Review of Systems  Constitutional: Positive for unexpected weight change. Negative for appetite change, chills and fever.  Respiratory: Negative for cough, shortness of breath and wheezing.   Gastrointestinal: Negative for abdominal pain.  Genitourinary: Negative for dysuria.  Neurological: Positive for dizziness. Negative for syncope.  Hematological: Negative for adenopathy.  Psychiatric/Behavioral: Positive for confusion. Negative for agitation and sleep disturbance.       Objective:   Physical Exam Constitutional:      General: He is not in acute distress.    Appearance: Normal appearance.  Cardiovascular:     Rate and Rhythm: Normal rate and regular rhythm.  Pulmonary:     Effort: Pulmonary effort is normal.     Breath sounds: Normal breath sounds.  Abdominal:     Palpations: Abdomen is soft. There is no mass.     Tenderness: There is no abdominal tenderness.  Musculoskeletal:     Right lower leg: No edema.  Left lower leg: No edema.  Neurological:     General: No focal deficit present.     Mental Status: He is alert.        Assessment:     #1 Alzheimer's dementia-severe.  He scored 15 out of 30 on MMSE back last April.  This was not repeated today.  He is refusing medications at this time  #2 weight loss.  Question secondary to #1.    Plan:     -Discussed weight loss with patient's wife and son.  They agree not to engage in any exhaustive search but we did agree to get some baseline labs including thyroid function, chemistries, CBC -We  discussed possible medications if he becomes agitated but at this point would try to avoid excessive medication -Avoid daytime napping as much as possible -Discussed safety issues such as no driving, not smoking unsupervised, etc. -They will check with insurance to see what options they have regarding part-time sitter  Eulas Post MD [facility]

## 2018-08-04 ENCOUNTER — Telehealth: Payer: Self-pay

## 2018-08-04 NOTE — Telephone Encounter (Signed)
Need this form back ASAP. Placed in red folder on your desk. Thanks!  Copied from [place] [phone]. Topic: General - Other >> Aug 02, 2018  4:51 PM Selinda Flavin B, NT wrote: Reason for CRM: Patient's wife calling and states that [employer] will be sending a fax that needs to be filled out ASAP for the patient to be able to go to classes during the day. Advised that paperwork turn around time can be 5-7 business days once we receive it. Patient's wife did not like that, said "oh no! This needs to be done sooner than that." States taht she will call back tomorrow to check.

## 2018-08-05 NOTE — Telephone Encounter (Signed)
done

## 2018-08-07 NOTE — Telephone Encounter (Signed)
Called patient and let her know that the paperwork has been faxed to the appropriate destination and fax was confirmed that it was sent. Wife verbalized an understanding.

## 2018-08-07 NOTE — Telephone Encounter (Signed)
Paperwork has been faxed. Waiting on confirmation before calling wife back.

## 2018-08-14 ENCOUNTER — Telehealth: Payer: Self-pay

## 2018-08-14 NOTE — Telephone Encounter (Signed)
Please advise 

## 2018-08-14 NOTE — Telephone Encounter (Signed)
Copied from [place] [phone]. Topic: General - Other >> Aug 14, 2018  4:35 PM Pilar Grammes F wrote: Reason for CRM:   Arnell Asal w/[employer] [facility] [employer] [phone] need Dr. Erick Blinks medical assist to call concerning a medical exam report he filled out for the patient, but forgot to check a box and she is looking for a verbal ok at this point.

## 2018-08-14 NOTE — Telephone Encounter (Signed)
Called patient and spoke to his wife and let her know that we did receive the paperwork and it has been successfully sent to appropriate destination and we did receive the fax confirmation. Patients wife verbalized an understanding.

## 2018-08-14 NOTE — Telephone Encounter (Signed)
Which "box" are they referring to.  I think there was one box I left empty because I did not understand what I was checking.

## 2018-08-14 NOTE — Telephone Encounter (Signed)
Copied from [place] [phone]. Topic: General - Other >> Aug 14, 2018  1:41 PM Lennox Solders wrote: Reason for CRM:pt wife is calling and [facility] send over different paperwork with in last 4 days.  Pt wife is calling to check on the status of paperwork. Pt had dementia

## 2018-08-15 NOTE — Telephone Encounter (Signed)
Called [facility] [employer] and spoke to [place] and she stated the box that allows them to continue caring for the patient. Box has been checked and refaxed to the appropriate destination.

## 2018-08-18 ENCOUNTER — Telehealth: Payer: Self-pay | Admitting: Family Medicine

## 2018-08-18 MED ORDER — RISPERIDONE 0.25 MG PO TABS: 0.2500 mg | ORAL_TABLET | Freq: Every day | ORAL | 1 refills | Status: DC
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
## 2018-09-10 ENCOUNTER — Other Ambulatory Visit: Payer: Self-pay | Admitting: Family Medicine

## 2018-09-11 NOTE — Telephone Encounter (Signed)
OK to refill for 90 days.   

## 2018-09-11 NOTE — Telephone Encounter (Signed)
Patient requests 90 days

## 2018-09-12 ENCOUNTER — Telehealth: Payer: Self-pay

## 2018-09-12 NOTE — Telephone Encounter (Signed)
Did she send FL-2 form here?  I have not seen any.  If not, do we have a FL-2 to complete. We just recently put him on Risperidone.   Is she requesting something in addition to this?  We should probably set up Webex with his wife to discuss further- would be attached to his chart but we can discuss with her.  His dementia is very advanced.

## 2018-09-12 NOTE — Telephone Encounter (Signed)
Copied from [place] [phone]. Topic: General - Inquiry >> Sep 12, 2018 10:20 AM Virl Axe D wrote: Reason for CRM: [name] called to request that Dr. Elease Hashimoto fill out an [facility]  for pt. There is availability at an assisted living community that they would like to get pt in as soon as possible. Please advise when this is ready for pickup. [name] Elvin So also stated that the AL community recommended she ask Dr. Elease Hashimoto to place pt on a mild sedative. She is asking if a rx can be sent to pharmacy. Please advise.

## 2018-09-12 NOTE — Telephone Encounter (Signed)
Please see message. I have not seen [facility] paperwork yet. Do you want a visit for the requested medication? And also for assisted living facility to complete paperwork?

## 2018-09-13 ENCOUNTER — Telehealth: Payer: Self-pay | Admitting: Family Medicine

## 2018-09-13 NOTE — Telephone Encounter (Signed)
Dennis Harper called and stated that she is having trouble keeping her husband at home and is trying to get him into a facility this Friday. I have scheduled 30 minutes on Friday morning so she can discuss his changes and go over everything with you. Just sending as FYI. Patient stated that she does need something in addition to the Risperidone.

## 2018-09-13 NOTE — Telephone Encounter (Signed)
Dennis Harper with [facility], she called in and stated that it would be great if the fl2 could be faxed to [phone] The phone is [phone].

## 2018-09-13 NOTE — Telephone Encounter (Signed)
Wife calling to request Dr Elease Hashimoto fill out the [facility] asap. Arbie Cookey confirmed we did receive the form and will give to the dr. Hansel Feinstein gives [facility] fax as: [phone]

## 2018-09-13 NOTE — Telephone Encounter (Signed)
[name] left a vm expressing that she wants someone to call her back immediately due to her being upset about the medication risperidone that was sent in for pt.

## 2018-09-14 NOTE — Telephone Encounter (Signed)
Copied from [place] [phone]. Topic: General - Inquiry >> Sep 12, 2018 10:20 AM Virl Axe D wrote: Reason for CRM: [name] called to request that Dr. Elease Hashimoto fill out an [facility]  for pt. There is availability at an assisted living community that they would like to get pt in as soon as possible. Please advise when this is ready for pickup. [name] Elvin So also stated that the AL community recommended she ask Dr. Elease Hashimoto to place pt on a mild sedative. She is asking if a rx can be sent to pharmacy. Please advise. >> Sep 14, 2018  1:38 PM Yvette Rack wrote: Pt wife Elvin So requests a call back regarding the FL2 form. Pt wife stated this is urgent and she needed it done yesterday. Cb# [phone]

## 2018-09-14 NOTE — Telephone Encounter (Signed)
Pt wife, Elvin So, called to see if FL2 form for [facility] was completed and returned. She is asking for call back asap. She has to leave home at 10am but will be home about 11am. Please call at [phone]

## 2018-09-15 ENCOUNTER — Other Ambulatory Visit: Payer: Self-pay

## 2018-09-15 ENCOUNTER — Ambulatory Visit (INDEPENDENT_AMBULATORY_CARE_PROVIDER_SITE_OTHER): Payer: Medicare Other | Admitting: Family Medicine

## 2018-09-15 DIAGNOSIS — G309 Alzheimer's disease, unspecified: Secondary | ICD-10-CM | POA: Diagnosis not present

## 2018-09-15 DIAGNOSIS — R634 Abnormal weight loss: Secondary | ICD-10-CM | POA: Diagnosis not present

## 2018-09-15 DIAGNOSIS — K219 Gastro-esophageal reflux disease without esophagitis: Secondary | ICD-10-CM

## 2018-09-15 DIAGNOSIS — F0281 Dementia in other diseases classified elsewhere with behavioral disturbance: Secondary | ICD-10-CM | POA: Diagnosis not present

## 2018-09-15 NOTE — Telephone Encounter (Signed)
Called Dennis Harper with [facility] at [phone] and left a detailed voice message to let her know that I have sent the [facility] form this morning and I did receive fax confirmation. I advised to call back to let me know if she needs for me to fax again.  Called son Dennis Harper to let him know and he stated he will try to call Dennis Din and if he needs to he can come to the office and pick up the form and take to [name].  OK for [facility] to discuss/advise/schedule family  CRM Created.
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
## 2018-09-15 NOTE — Telephone Encounter (Signed)
I do not recommend any sedatives.  He has an extremely high risk of falling and fracture, etc.  I discussed this with his wife this morning.

## 2018-09-15 NOTE — Telephone Encounter (Signed)
I faxed the FL2 form that I printed from website today. I have not seen the form that they received at the front desk and I have checked all folders.  Can you sent the Rx that she is asking about?

## 2018-09-19 ENCOUNTER — Telehealth: Payer: Self-pay | Admitting: Family Medicine

## 2018-09-19 MED ORDER — LORAZEPAM 0.5 MG PO TABS: ORAL_TABLET | ORAL | 0 refills | Status: DC

## 2018-09-19 NOTE — Telephone Encounter (Signed)
Sent in limited Lorazepam.  We had discussed with wife previously that would not be ideal to keep on benzodiazepines long term- but this may help with his agitation until they can get him transitioned.

## 2018-09-19 NOTE — Telephone Encounter (Signed)
Patient's wife called to see about medication being called in.  She wanted to hold to talk to [facility] or Dr Elease Hashimoto.  She is waiting for a response.

## 2018-09-19 NOTE — Telephone Encounter (Signed)
Copied from [place] [id number]. Topic: Quick Communication - See Telephone Encounter >> Sep 19, 2018  1:06 PM Burchel, Abbi R wrote: CRM for notification. See Telephone encounter for: 09/19/18.  Pt's son states that pt was supposed to be checked-in to assisted living facility today, but pt is combative and non-compliant.  Pt's son and wife are requesting a call from Dr Elease Hashimoto with advise on next steps.    Please call Lennette Bihari (son) [phone]

## 2018-09-19 NOTE — Telephone Encounter (Signed)
Let's try just short term Lorazepam 0.5 mg one every 6 hours prn agitation #10 with no refills-

## 2018-09-19 NOTE — Telephone Encounter (Signed)
Please advise. Do you want to set up Doxy for today?

## 2018-09-19 NOTE — Telephone Encounter (Signed)
You will need to send this in since it is a controlled substance. I will call them and let them know.
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
## 2018-09-20 NOTE — Telephone Encounter (Signed)
Called son Lennette Bihari and gave him the information Dr. Elease Hashimoto sent in the message. Son verbalized an understanding. Son stated that his dad will be moved to the facility on Tuesday, 09/26/18 and he will have his mom hold off on the Lorazepam until they transition him on Tuesday, start giving on Monday into Tuesday. Son verbalized an understanding.

## 2018-09-21 ENCOUNTER — Encounter: Payer: Self-pay | Admitting: Family Medicine

## 2018-09-21 NOTE — Telephone Encounter (Signed)
Called son and he stated that his dad has become more upset and they have started giving the Ativan between 2-3pm and the Risperidone at 10pm. They are hoping to get him to [facility] on Tuesday, 09/26/18.   Son Dennis Harper needs a letter stating that his fathers mental status has declined and he is not able to make logical decisions for himself and could become harmful to himself and others. (Please have this in letter-copy and paste)  Please advise when letter is complete and I will call son to come pick it up.

## 2018-09-21 NOTE — Telephone Encounter (Signed)
Called son and he is going to come to the office to pick up letter for his dad. Lennette Bihari verbalized an understanding.

## 2018-09-21 NOTE — Telephone Encounter (Signed)
Called son Dennis Harper and it rang then cut off. No voice mail came up. I will try back.

## 2018-09-21 NOTE — Telephone Encounter (Signed)
done

## 2018-09-24 ENCOUNTER — Emergency Department (HOSPITAL_COMMUNITY)
Admission: EM | Admit: 2018-09-24 | Discharge: 2018-09-25 | Disposition: A | Discharge status: Home / Self Care | Payer: Medicare Other | Attending: Emergency Medicine | Admitting: Emergency Medicine

## 2018-09-24 ENCOUNTER — Other Ambulatory Visit: Payer: Self-pay

## 2018-09-24 DIAGNOSIS — F17290 Nicotine dependence, other tobacco product, uncomplicated: Secondary | ICD-10-CM | POA: Diagnosis not present

## 2018-09-24 DIAGNOSIS — R4689 Other symptoms and signs involving appearance and behavior: Secondary | ICD-10-CM

## 2018-09-24 DIAGNOSIS — F0391 Unspecified dementia with behavioral disturbance: Secondary | ICD-10-CM

## 2018-09-24 DIAGNOSIS — G309 Alzheimer's disease, unspecified: Secondary | ICD-10-CM | POA: Insufficient documentation

## 2018-09-24 DIAGNOSIS — F0281 Dementia in other diseases classified elsewhere with behavioral disturbance: Secondary | ICD-10-CM | POA: Diagnosis not present

## 2018-09-24 DIAGNOSIS — Z79899 Other long term (current) drug therapy: Secondary | ICD-10-CM | POA: Insufficient documentation

## 2018-09-24 DIAGNOSIS — F0280 Dementia in other diseases classified elsewhere without behavioral disturbance: Secondary | ICD-10-CM | POA: Diagnosis present

## 2018-09-24 LAB — CBC WITH DIFFERENTIAL/PLATELET
Abs Immature Granulocytes: 0.03 10*3/uL (ref 0.00–0.07)
Basophils Absolute: 0 10*3/uL (ref 0.0–0.1)
Basophils Relative: 0 %
Eosinophils Absolute: 0 10*3/uL (ref 0.0–0.5)
Eosinophils Relative: 0 %
HCT: 40.5 % (ref 39.0–52.0)
Hemoglobin: 13.1 g/dL (ref 13.0–17.0)
Immature Granulocytes: 0 %
Lymphocytes Relative: 23 %
Lymphs Abs: 1.6 10*3/uL (ref 0.7–4.0)
MCH: 30.4 pg (ref 26.0–34.0)
MCHC: 32.3 g/dL (ref 30.0–36.0)
MCV: 94 fL (ref 80.0–100.0)
Monocytes Absolute: 0.4 10*3/uL (ref 0.1–1.0)
Monocytes Relative: 6 %
Neutro Abs: 5 10*3/uL (ref 1.7–7.7)
Neutrophils Relative %: 71 %
Platelets: 279 10*3/uL (ref 150–400)
RBC: 4.31 MIL/uL (ref 4.22–5.81)
RDW: 13.5 % (ref 11.5–15.5)
WBC: 7.1 10*3/uL (ref 4.0–10.5)
nRBC: 0 % (ref 0.0–0.2)

## 2018-09-24 LAB — COMPREHENSIVE METABOLIC PANEL
ALT: 23 U/L (ref 0–44)
AST: 30 U/L (ref 15–41)
Albumin: 4.1 g/dL (ref 3.5–5.0)
Alkaline Phosphatase: 46 U/L (ref 38–126)
Anion gap: 9 (ref 5–15)
BUN: 10 mg/dL (ref 8–23)
CO2: 22 mmol/L (ref 22–32)
Calcium: 8.8 mg/dL — ABNORMAL LOW (ref 8.9–10.3)
Chloride: 103 mmol/L (ref 98–111)
Creatinine, Ser: 0.83 mg/dL (ref 0.61–1.24)
GFR calc Af Amer: >60 mL/min (ref >60)
GFR calc non Af Amer: >60 mL/min (ref >60)
Glucose, Bld: 97 mg/dL (ref 70–99)
Potassium: 3.7 mmol/L (ref 3.5–5.1)
Sodium: 134 mmol/L — ABNORMAL LOW (ref 135–145)
Total Bilirubin: 0.7 mg/dL (ref 0.3–1.2)
Total Protein: 7.1 g/dL (ref 6.5–8.1)

## 2018-09-24 LAB — ETHANOL: Alcohol, Ethyl (B): <10 mg/dL (ref <10)

## 2018-09-24 LAB — TROPONIN I: Troponin I: <0.03 ng/mL (ref <0.03)

## 2018-09-24 MED ORDER — ZIPRASIDONE MESYLATE 20 MG IM SOLR
10.0000 mg | Freq: Once | INTRAMUSCULAR | Status: AC
  Administered 2018-09-24: 10 mg via INTRAMUSCULAR
  Filled 2018-09-24: qty 20

## 2018-09-24 MED ORDER — LORAZEPAM 1 MG PO TABS
1.0000 mg | ORAL_TABLET | ORAL | Status: DC | PRN
  Filled 2018-09-24 (×2): qty 1

## 2018-09-24 MED ORDER — HALOPERIDOL LACTATE 5 MG/ML IJ SOLN
5.0000 mg | Freq: Once | INTRAMUSCULAR | Status: AC
  Administered 2018-09-24: 5 mg via INTRAMUSCULAR
  Filled 2018-09-24: qty 1

## 2018-09-24 MED ORDER — RISPERIDONE 0.5 MG PO TABS: 0.2500 mg | ORAL_TABLET | Freq: Every day | ORAL | Status: DC

## 2018-09-24 MED ORDER — STERILE WATER FOR INJECTION IJ SOLN
INTRAMUSCULAR | Status: AC
  Administered 2018-09-24: 15:00:00
  Filled 2018-09-24: qty 10

## 2018-09-24 MED ORDER — STERILE WATER FOR INJECTION IJ SOLN
INTRAMUSCULAR | Status: AC
  Administered 2018-09-24: 10 mL
  Filled 2018-09-24: qty 10

## 2018-09-24 MED ORDER — ZIPRASIDONE MESYLATE 20 MG IM SOLR
10.0000 mg | Freq: Once | INTRAMUSCULAR | Status: AC | PRN
  Administered 2018-09-24: 10 mg via INTRAMUSCULAR
  Filled 2018-09-24: qty 20

## 2018-09-24 MED ORDER — QUETIAPINE FUMARATE 50 MG PO TABS
50.0000 mg | ORAL_TABLET | Freq: Every day | ORAL | Status: DC
  Filled 2018-09-24: qty 1

## 2018-09-24 MED ORDER — ACETAMINOPHEN 325 MG PO TABS
650.0000 mg | ORAL_TABLET | ORAL | Status: DC | PRN
  Filled 2018-09-24: qty 2

## 2018-09-24 MED ORDER — OLANZAPINE 5 MG PO TBDP
5.0000 mg | ORAL_TABLET | Freq: Three times a day (TID) | ORAL | Status: DC | PRN
  Filled 2018-09-24: qty 1

## 2018-09-24 MED ORDER — ZIPRASIDONE MESYLATE 20 MG IM SOLR: 20.0000 mg | INTRAMUSCULAR | Status: DC | PRN

## 2018-09-24 NOTE — ED Notes (Signed)
Pt has been dressed out in burgundy scrubs and belongings taken by security.  Pt is becoming aggitated again.
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
## 2018-09-24 NOTE — ED Notes (Signed)
RN was attempted to assist in calming patient down and he was requesting to speak to his wife. RN called wife and patient is cyclicly arguing with his spouse on the phone to come get him. Patient is getting agitated at this time.

## 2018-09-24 NOTE — ED Notes (Signed)
Pt agitated, requesting to return to the main ed to his original room.  Pt also requesting his belongings.  Security and [employer] at bedside for assistance.  Pt refused PO meds, IM Haldol given.  1-1 Safety sitter at bedside.
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
## 2018-09-24 NOTE — ED Notes (Signed)
After I&O completed, pt stated that he needed to go to the bathroom Pt ambulatory to the bathroom with one person assist Pt able to urinate approximately 10cc to 15cc Pt ambulatory back to room and settled in bed Pt calm and attempting to rest
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
## 2018-09-24 NOTE — ED Notes (Signed)
Pt becoming agitated, pt beginning cyclical arguing and pacing around the room. Pt repeatedly stating that "Im going to go". PRN Geodon after previous deescalating/distration attempts were unsuccessful.

## 2018-09-24 NOTE — ED Notes (Signed)
This RN tried several times to get patient to go back in room so I could get blood work and EKG to check his heart.  Pt refusing to go back in his room, states "my blood is good and my heart is pumping good now!"

## 2018-09-24 NOTE — ED Triage Notes (Signed)
Patient reports he was brought in because his wife called 911 to have him brought to the ER for psychiatric evaluation for dementia.

## 2018-09-24 NOTE — ED Notes (Signed)
Pt wanting to leave because he wants to see his wife.  Pt in hallway. Joe, BHT, attempting to re-direct patient to go back in room.  Pt refusing at this time.

## 2018-09-24 NOTE — Progress Notes (Signed)
Patient ID: Dennis Harper, male   DOB: 1941-03-03, 78 y.o.   MRN: [id number]   Pt was seen by this provider and TTS via tele psych. Chart reviewed with treatment team and Dr Dwyane Dee. Pt is a 78 year old male with a history of advanced dementia. Pt has been aggressive and agitated toward his wife. He was attempting to walk to the hospital today and his wife called EMS. [name] was contacted for collateral, please see TTS note for this information. Pt has an assisted living placement at [facility] on Tuesday 4/14. Per chart review, Pt  received Geodon while in the emergency room for his agitation but he continued to be agitated. Pt was then given Haldol and has become calmer and is tidying up his room. Pt will remain in the emergency room overnight for safety and to monitor for medication side effects. Will place Seroquel 50 mg QHS on Pt's chart. Pt will be evaluated by psychiatry in the morning for possible discharge home.   Ethelene Hal, FNP-C 09/24/2018      1821

## 2018-09-24 NOTE — ED Notes (Signed)
Joe, BHT, is sitting with patient.

## 2018-09-24 NOTE — ED Notes (Signed)
Pt wandering in room at present.  Pt calmer at present.  1-1 sitter remains at bedside.  Awake. Alert & responsive, no distress noted, Monitoring for safety.  Pt is IVC.

## 2018-09-25 DIAGNOSIS — F0281 Dementia in other diseases classified elsewhere with behavioral disturbance: Secondary | ICD-10-CM

## 2018-09-25 DIAGNOSIS — G309 Alzheimer's disease, unspecified: Secondary | ICD-10-CM

## 2018-09-25 LAB — URINALYSIS, ROUTINE W REFLEX MICROSCOPIC
Bacteria, UA: NONE SEEN
Bilirubin Urine: NEGATIVE
Glucose, UA: NEGATIVE mg/dL
Ketones, ur: 5 mg/dL — AB
Leukocytes,Ua: NEGATIVE
Nitrite: NEGATIVE
Protein, ur: NEGATIVE mg/dL
Specific Gravity, Urine: 1.003 — ABNORMAL LOW (ref 1.005–1.030)
pH: 7 (ref 5.0–8.0)

## 2018-09-25 LAB — RAPID URINE DRUG SCREEN, HOSP PERFORMED
Amphetamines: NOT DETECTED
Barbiturates: NOT DETECTED
Benzodiazepines: NOT DETECTED
Cocaine: NOT DETECTED
Opiates: NOT DETECTED
Tetrahydrocannabinol: NOT DETECTED

## 2018-09-25 NOTE — ED Notes (Signed)
UA, UDS and Urine culture obtained & sent to lab.

## 2018-09-25 NOTE — ED Notes (Signed)
Pt discharged home. Discharged instructions read to pt who verbalized understanding. All belongings returned to pt. Denies SI/HI, is not delusional and not responding to internal stimuli. Escorted pt to the ED exit where is waiting in their car for him. Assisted pt into the car.

## 2018-09-25 NOTE — ED Notes (Signed)
Sitter 1:1 Dennis Harper is asleep, uninterupted

## 2018-09-25 NOTE — ED Notes (Signed)
Pt is ready to leave. His wife is on the way. He seems to understand this.

## 2018-09-25 NOTE — ED Notes (Signed)
Sitter 1:1 Dennis Harper is asleep, no safety issues

## 2018-09-25 NOTE — ED Notes (Signed)
Sitter 1:1 Lanny Hurst has been watching TV for the past hour. Lanny Hurst got up to use the bathroom this hour. No problems or concerns reported.

## 2018-09-25 NOTE — ED Notes (Signed)
Sitter 1:1 Lanny Hurst got up to use the bathroom, provided a urine sample. Got back into bed. No issues to report.

## 2018-09-25 NOTE — Consult Note (Signed)
[facility] Psych ED Discharge  09/25/2018 10:39 AM Dennis Harper  MRN:  [id number] Principal Problem: Alzheimer's dementia [facility]) Discharge Diagnoses: Principal Problem:   Alzheimer's dementia [facility])   Subjective: Dennis Harper was admitted with worsening agitation in the setting of dementia. He did not have any acute events overnight. He has been watching television. He sleep well last night. He ate his breakfast this morning. He is only oriented to person. He believes the month is February. He denies SI, HI or AVH. He was given a dose of Haldol overnight for agitation.   Total Time spent with patient: 30 minutes  Past Psychiatric History: Dementia   Past Medical History:  Past Medical History:  Diagnosis Date  . GALLSTONES 01/06/2010   Qualifier: Diagnosis of  By: Elease Hashimoto MD, Bruce    . GERD (gastroesophageal reflux disease)   . 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 APPENDECTOMY     age 23  . INGUINAL HERNIA REPAIR Right 08/06/2015   Procedure: RIGHT INGUINAL HERNIA REPAIR WITH MESH;  Surgeon: Coralie Keens, MD;  Location: [place];  Service: General;  Laterality: Right;  . INSERTION OF MESH Right 08/06/2015   Procedure: INSERTION OF MESH;  Surgeon: Coralie Keens, MD;  Location: [place];  Service: General;  Laterality: Right;   Family History:  Family History  Problem Relation Age of Onset  . Alcohol abuse Neg Hx        family hx  . Heart disease Neg Hx        family hx   Family Psychiatric  History: None per chart review. Social History:  Social History   Substance and Sexual Activity  Alcohol Use Yes  . Alcohol/week: 0.0 standard drinks   Comment: social     Social History   Substance and Sexual Activity  Drug Use No    Social History   Socioeconomic History  . Marital status: Married    Spouse name: Not on file  .  Number of children: 1  . Years of education: Not on file  . Highest education level: Not on file  Occupational History  . Occupation: Marketing executive Work  Scientific laboratory technician  . Financial resource strain: Not on file  . Food insecurity:    Worry: Not on file    Inability: Not on file  . Transportation needs:    Medical: Not on file    Non-medical: Not on file  Tobacco Use  . Smoking status: Current Some Day Smoker    Types: Cigars  . Smokeless tobacco: Never Used  . Tobacco comment: smokes occ cigar 2-3 time/week  Substance and Sexual Activity  . Alcohol use: Yes    Alcohol/week: 0.0 standard drinks    Comment: social  . Drug use: No  . Sexual activity: Not Currently  Lifestyle  . Physical activity:    Days per week: Not on file    Minutes per session: Not on file  . Stress: Not on file  Relationships  . Social connections:    Talks on phone: Not on file    Gets together: Not on file    Attends religious service: Not on file    Active member of club or organization: Not on file    Attends meetings of clubs or organizations: Not on file    Relationship status: Not on file  Other Topics  Concern  . Not on file  Social History Narrative  . Not on file    Has this patient used any form of tobacco in the last 30 days? (Cigarettes, Smokeless Tobacco, Cigars, and/or Pipes) Prescription not provided because: N/A  Current Medications: Current Facility-Administered Medications  Medication Dose Route Frequency Provider Last Rate Last Dose  . acetaminophen (TYLENOL) tablet 650 mg  650 mg Oral Q4H PRN Little, Wenda Overland, MD      . OLANZapine zydis (ZYPREXA) disintegrating tablet 5 mg  5 mg Oral Q8H PRN Little, Wenda Overland, MD       And  . LORazepam (ATIVAN) tablet 1 mg  1 mg Oral PRN Little, Wenda Overland, MD       And  . ziprasidone (GEODON) injection 20 mg  20 mg Intramuscular PRN Little, Wenda Overland, MD      . QUEtiapine (SEROQUEL) tablet 50 mg  50 mg Oral QHS Ethelene Hal, NP   Stopped at 09/24/18 2114  . risperiDONE (RISPERDAL) tablet 0.25 mg  0.25 mg Oral QHS Little, Wenda Overland, MD   Stopped at 09/24/18 2114   Current Outpatient Medications  Medication Sig Dispense Refill  . ASPIRIN 81 PO Take 162 mg by mouth daily as needed (pain).    . LORazepam (ATIVAN) 0.5 MG tablet Take one tablet every 6 hours as needed for extreme agitation. 10 tablet 0  . risperiDONE (RISPERDAL) 0.25 MG tablet TAKE 1 TABLET (0.25 MG TOTAL) BY MOUTH AT BEDTIME. 90 tablet 1  . donepezil (ARICEPT) 10 MG tablet Take 1 tablet (10 mg total) by mouth at bedtime. (Patient not taking: Reported on 09/24/2018) 30 tablet 11  . memantine (NAMENDA) 10 MG tablet Take 1 tablet (10 mg total) by mouth 2 (two) times daily. (Patient not taking: Reported on 08/04/2016) 60 tablet 11   PTA Medications: (Not in a hospital admission)   Musculoskeletal: Strength & Muscle Tone: within normal limits Gait & Station: normal Patient leans: N/A  Psychiatric Specialty Exam: Physical Exam  Nursing note and vitals reviewed. Constitutional: He appears well-developed and well-nourished.  HENT:  Head: Normocephalic and atraumatic.  Neck: Normal range of motion.  Respiratory: Effort normal.  Musculoskeletal: Normal range of motion.  Neurological: He is alert.  Only oriented to person.    Psychiatric: He has a normal mood and affect. His speech is normal and behavior is normal. Judgment and thought content normal. Cognition and memory are impaired.    Review of Systems  Psychiatric/Behavioral: Negative for depression, hallucinations and suicidal ideas. The patient does not have insomnia.   All other systems reviewed and are negative.   Blood pressure 122/65, pulse 82, temperature 98 F (36.7 C), temperature source Oral, resp. rate 18, height 5\' 10"  (1.778 m), weight 77.1 kg, SpO2 100 %.Body mass index is 24.39 kg/m.  General Appearance: Fairly Groomed, elderly, Caucasian male, wearing paper  hospital scrubs who is standing in his room. NAD.   Eye Contact:  Good  Speech:  Clear and Coherent and Normal Rate  Volume:  Normal  Mood:  Euthymic  Affect:  Appropriate and Congruent  Thought Process:  Goal Directed, Linear and Descriptions of Associations: Intact  Orientation:  Other:  Only oriented to person.  Thought Content:  Logical  Suicidal Thoughts:  No  Homicidal Thoughts:  No  Memory:  Immediate;   Poor Recent;   Poor Remote;   Fair  Judgement:  Impaired  Insight:  Lacking  Psychomotor Activity:  Normal  Concentration:  Concentration: Good and Attention Span: Good  Recall:  Poor  Fund of Knowledge:  Poor  Language:  Good  Akathisia:  No  Handed:  Right  AIMS (if indicated):   N/A  Assets:  Communication Skills Intimacy Physical Health Resilience Social Support  ADL's:  Impaired  Cognition: Impaired due to dementia.   Sleep:   Okay     Demographic Factors:  Male, Age 45 or older, Caucasian and Unemployed  Loss Factors: NA  Historical Factors: Impulsivity  Risk Reduction Factors:   Living with another person, especially a relative and Positive social support  Continued Clinical Symptoms:  Medical Diagnoses and Treatments/Surgeries  Cognitive Features That Contribute To Risk:  None    Suicide Risk:  Minimal: No identifiable suicidal ideation.  Patients presenting with no risk factors but with morbid ruminations; may be classified as minimal risk based on the severity of the depressive symptoms  Assessment:  Dennis Harper is a 78 y.o. male who was admitted with worsening agitation in the setting of dementia. He received Haldol yesterday with good response. Per history he appears to sundown. Would recommend continuing Risperdal as needed for agitation. He is planned to go to a memory care unit tomorrow. He does not warrant inpatient psychiatric hospitalization at this time.   Plan Of Care/Follow-up recommendations:  -Continue psychotropic  medications as prescribed for agitation.   Disposition: Discharge home. Faythe Dingwall, DO 09/25/2018, 10:39 AM

## 2018-09-25 NOTE — BH Assessment (Signed)
[facility] Assessment Progress Note  Per Buford Dresser, DO this pt does not require psychiatric hospitalization at this time.  Pt is psychiatrically cleared.  Pt presents under IVC initiated by EDP Gerlene Fee, MD, which Dr Mariea Clonts has rescinded.  Pt does not need behavioral health referrals for his discharge instructions.  Kingsley Spittle, CSW, agrees to facilitate pt's return to the community.  Pt's nurse, Diane, has been notified.  Jalene Mullet, [place] Triage Specialist [phone]

## 2018-09-25 NOTE — ED Notes (Signed)
Sitter 1:1 Lanny Hurst asleep

## 2018-09-25 NOTE — ED Notes (Signed)
Pt has been calm today and cooperative. A&O to self only. Believes that it is Feb and said that he would need to study up to know the year. No aware of current events.

## 2018-09-25 NOTE — ED Notes (Signed)
Pt calmer and resting at present.

## 2018-09-25 NOTE — Progress Notes (Signed)
CSW aware patient stable for discharge- CSW spoke with patients spouse, Elvin So, regarding disposition plans. Spouse states patient has a bed at [facility] and requested CSW to reach out to determine if facility could accepted patient today/ if they need any additional information. Spouse states they have already completed paperwork and provided payment to facility.   CSW spoke with Pam at [facility], [phone], who requested notes stating patient is medically/ psychiatrically cleared and then will follow up with CSW.   Kingsley Spittle, LCSW [facility]  [phone]

## 2018-09-26 ENCOUNTER — Telehealth: Payer: Self-pay

## 2018-09-26 NOTE — Telephone Encounter (Signed)
Patients son Dennis Harper called to let us know that his dad was in the hospital on 09/24/18 and he wanted to know if his dads medicines have changed and I do not see anything in the note and I did see a note that said to continue taking current medications. Dennis Harper will let Pam at [facility] know and will call us if he needs anything else for his dad.

## 2018-09-26 NOTE — Telephone Encounter (Signed)
Copied from [place] [id number]. Topic: General - Other >> Sep 26, 2018  2:58 PM Valla Leaver wrote: Reason for CRM: Lennette Bihari, pt son, would like a call back from [place] [facility] cma to update on patients condition. His dementia is progressing rapidly.

## 2018-09-26 NOTE — Telephone Encounter (Signed)
Copied from [place] [id number]. Topic: General - Inquiry >> Sep 26, 2018  9:55 AM Richardo Priest, NT wrote: Reason for CRM: Patient's wife called in stating husband is admitted at [facility] and requesting some information to transfer patient to an assisted living facility. Call back number is [phone].

## 2018-09-26 NOTE — Telephone Encounter (Signed)
Spoke with wife at length.  He should be admitted tomorrow.  Advised that she give one Lorazepam in AM and one Risperdal 0.25 mg.  He is already taking the Risperdal at night and seems to be sleeping well.

## 2018-09-26 NOTE — Telephone Encounter (Signed)
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 Can Dr. Elease Hashimoto help or do they need a referral as they are trying to get him in facility as soon as they can because wife is not able to care for patient. They do not want him to become violent as the facility stated that they will not be able to care for him and he could have problems getting in another facility.  If need be can call son Dennis Harper at [phone].  Please advise.

## 2018-09-27 ENCOUNTER — Ambulatory Visit (INDEPENDENT_AMBULATORY_CARE_PROVIDER_SITE_OTHER): Payer: Medicare Other | Admitting: Family Medicine

## 2018-09-27 ENCOUNTER — Other Ambulatory Visit: Payer: Self-pay

## 2018-09-27 DIAGNOSIS — G309 Alzheimer's disease, unspecified: Secondary | ICD-10-CM | POA: Diagnosis not present

## 2018-09-27 DIAGNOSIS — F0281 Dementia in other diseases classified elsewhere with behavioral disturbance: Secondary | ICD-10-CM | POA: Diagnosis not present

## 2018-09-27 MED ORDER — CITALOPRAM HYDROBROMIDE 10 MG PO TABS: 10.0000 mg | ORAL_TABLET | Freq: Every day | ORAL | 1 refills | Status: DC

## 2018-09-27 MED ORDER — LORAZEPAM 0.5 MG PO TABS: ORAL_TABLET | ORAL | 2 refills | Status: DC

## 2018-09-27 NOTE — Progress Notes (Signed)
Virtual Visit via Video Note  I connected with Lajoyce Lauber on 09/27/18 at  5:45 PM EDT by a video enabled telemedicine application and verified that I am speaking with the correct person using two identifiers.  Location patient: home Location provider:work or home office Persons participating in the virtual visit: patient, provider  I discussed the limitations of evaluation and management by telemedicine and the availability of in person appointments. The patient expressed understanding and agreed to proceed.   HPI: This video conference was attended by patient who has dementia along with his wife and son Lennette Bihari.  They are in process of trying to get him into nursing home (memory unit) but has had some increased agitation which prompted recent ER visit.  What they relate is that he is sleeping fairly well.  Usually goes to bed around 8 30-9 and may get up once to urinate but then sleeps until about 7 AM.  He seems to do good during the morning hours but his trouble seems to be late afternoons.  He has been fixated recently ongoing places in his car.  They have locked the car and are thinking about removing the car off premises altogether.  This seems to get him very agitated. He has been physically aggressive toward wife at times and medications seem to have helped some.  On recent visit to ER he was given multiple medications including Zyprexa and Geodon.  He is currently taking Risperdal 0.25 mg 1-2 at night and wife thinks that is working well for his agitation at night.  We had prescribed limited lorazepam 0.5 mg and that also seems to be helping for as needed agitation.  They are using this in very limited way.   Recent fever.  No complaints of chest pain.  No recent falls.   ROS: See pertinent positives and negatives per HPI.  Past Medical History:  Diagnosis Date  . GALLSTONES 01/06/2010   Qualifier: Diagnosis of  By: Elease Hashimoto MD, Carole Doner    . GERD (gastroesophageal reflux disease)   .  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 APPENDECTOMY     age 31  . INGUINAL HERNIA REPAIR Right 08/06/2015   Procedure: RIGHT INGUINAL HERNIA REPAIR WITH MESH;  Surgeon: Coralie Keens, MD;  Location: [place];  Service: General;  Laterality: Right;  . INSERTION OF MESH Right 08/06/2015   Procedure: INSERTION OF MESH;  Surgeon: Coralie Keens, MD;  Location: [place];  Service: General;  Laterality: Right;    Family History  Problem Relation Age of Onset  . Alcohol abuse Neg Hx        family hx  . Heart disease Neg Hx        family hx    SOCIAL HX: Long history of smoking.  No alcohol use.   Current Outpatient Medications:  .  ASPIRIN 81 PO, Take 162 mg by mouth daily as needed (pain)., Disp: , Rfl:  .  citalopram (CELEXA) 10 MG tablet, Take 1 tablet (10 mg total) by mouth daily., Disp: 90 tablet, Rfl: 1 .  donepezil (ARICEPT) 10 MG tablet, Take 1 tablet (10 mg total) by mouth at bedtime. (Patient not taking: Reported on 09/24/2018), Disp: 30 tablet, Rfl: 11 .  LORazepam (ATIVAN) 0.5 MG tablet, Take one tablet every 6 hours as needed for extreme agitation., Disp: 30 tablet, Rfl: 2 .  memantine (NAMENDA) 10 MG tablet, Take 1 tablet (10 mg total) by mouth 2 (two) times daily. (Patient not taking: Reported on 08/04/2016), Disp: 60 tablet, Rfl: 11 .  risperiDONE (RISPERDAL) 0.25 MG tablet, TAKE 1 TABLET (0.25 MG TOTAL) BY MOUTH AT BEDTIME., Disp: 90 tablet, Rfl: 1  EXAM:  VITALS per patient if applicable:  GENERAL: alert, oriented, appears well and in no acute distress  HEENT: atraumatic, conjunttiva clear, no obvious abnormalities on inspection of external nose and ears  NECK: normal movements of the head and neck  LUNGS: on inspection no signs of respiratory distress, breathing rate appears normal, no obvious gross SOB, gasping or  wheezing  CV: no obvious cyanosis  MS: moves all visible extremities without noticeable abnormality  PSYCH/NEURO: pleasant and cooperative, no obvious depression or anxiety, speech and thought processing grossly intact  ASSESSMENT AND PLAN:  Discussed the following assessment and plan:  Dementia with behavioral disturbance.  -Discussed nonpharmacologic factors in management of dementia/agitation -Discussed safety issues.  Driving capability has been taken away.  No firearms. -Start low-dose citalopram 10 mg once daily and may consider titration up in a few weeks depending on response -Continue low-dose Risperdal at night.  We have previously discussed risk of atypical antipsychotics but at this point family is focused more on quality -Refill lorazepam 0.5 mg to use sparingly for severe agitation -They are still looking at getting him transitioned to permanent care unit once his agitation has settled down somewhat  Spent over 25 minutes discussing safety issues, medications, non-pharmacologic issues in management.     I discussed the assessment and treatment plan with the patient. The patient was provided an opportunity to ask questions and all were answered. The patient agreed with the plan and demonstrated an understanding of the instructions.   The patient was advised to call back or seek an in-person evaluation if the symptoms worsen or if the condition fails to improve as anticipated   Carolann Littler, MD

## 2018-10-03 ENCOUNTER — Other Ambulatory Visit: Payer: Self-pay

## 2018-10-04 NOTE — Telephone Encounter (Signed)
OK to continue? I am not sure if he is in the facility yet?

## 2018-10-04 NOTE — Telephone Encounter (Signed)
Refill OK

## 2018-10-05 ENCOUNTER — Telehealth: Payer: Self-pay | Admitting: Family Medicine

## 2018-10-05 ENCOUNTER — Telehealth: Payer: Self-pay

## 2018-10-05 MED ORDER — RISPERIDONE 0.25 MG PO TABS: 0.2500 mg | ORAL_TABLET | Freq: Every day | ORAL | 1 refills | Status: DC

## 2018-10-05 NOTE — Telephone Encounter (Signed)
Called wife back and she stated that her husband is not wanting to take his medicine at times and if she crushes it up in his water he will pour it out. Wife states that patient is getting very agitated and she is not sure if he is responding well to the medications. I advised to wife that it takes a some time for a new medication to get into patients system. Wife stated that patient is not eating well but he is drinking well. I have set up a Doxy visit with wife and son tomorrow at 1:15pm. Not sure if there is a specialist for this disease or a special facility for this progression of behavior. They both state that they are at a loss and not sure where to go from here. Son Lennette Bihari states that he does not think [facility] will be able to handle him and he does not know of another facility to try.  Doxy visit tomorrow.

## 2018-10-05 NOTE — Telephone Encounter (Signed)
Copied from [place] [id number]. Topic: Quick Communication - See Telephone Encounter >> Oct 05, 2018 11:15 AM Ivar Drape wrote: CRM for notification. See Telephone encounter for: 10/05/18. Patient's spouse would like to talk to Dr. Elease Hashimoto about the patient's behavior this morning.  Patient is screeming at her and her helper.  He wants to drive his car and is upset that no one is letting him drive it.  He is threatening everyone in the house. She needs to know what to do about it. ASAP, because she is very frightened.

## 2018-10-05 NOTE — Telephone Encounter (Signed)
Sending as an update to previous message.

## 2018-10-06 ENCOUNTER — Other Ambulatory Visit: Payer: Self-pay

## 2018-10-06 ENCOUNTER — Ambulatory Visit (INDEPENDENT_AMBULATORY_CARE_PROVIDER_SITE_OTHER): Payer: Medicare Other | Admitting: Family Medicine

## 2018-10-06 ENCOUNTER — Ambulatory Visit: Payer: Medicare Other | Admitting: Family Medicine

## 2018-10-06 DIAGNOSIS — F0281 Dementia in other diseases classified elsewhere with behavioral disturbance: Secondary | ICD-10-CM | POA: Diagnosis not present

## 2018-10-06 DIAGNOSIS — G309 Alzheimer's disease, unspecified: Secondary | ICD-10-CM

## 2018-10-06 NOTE — Progress Notes (Signed)
Patient ID: Dennis Harper, male   DOB: 04/07/1941, 78 y.o.   MRN: [id number]  This visit type was conducted due to national recommendations for restrictions regarding the COVID-19 pandemic in an effort to limit this patient's exposure and mitigate transmission in our community.   Virtual Visit via Video Note  I connected with Dennis Harper on 10/06/18 at  1:15 PM EDT by a video enabled telemedicine application and verified that I am speaking with the correct person using two identifiers.  Location patient: home Location provider:work or home office Persons participating in the virtual visit: patient, provider  I discussed the limitations of evaluation and management by telemedicine and the availability of in person appointments. The patient expressed understanding and agreed to proceed.   HPI: Patient has advanced dementia with behavioral disturbance and we conducted this conference video session with his wife and son who are providing care for him.  They were in process of trying to get him transition to memory care unit but he has had some recent increased agitation.  We recently started Celexa 10 mg daily.  He has been somewhat reluctant to take this but wife has been able to crush this up and give in foods like pudding.  She is still giving Risperdal 0.25 mg just at night.  He is having some daytime agitation.  Most of this is centered on his fixation about driving the car.  Wife has all the keys.  Son plans to take the car and hide it soon so this will not be a trigger for his agitation.  They are trying to keep him engaged with things like puzzles and checkers during the day  They have been using lorazepam as needed for severe agitation.  He has had good appetite.  No fever.  Not complaining of any pain.   ROS: See pertinent positives and negatives per HPI.  Past Medical History:  Diagnosis Date  . GALLSTONES 01/06/2010   Qualifier: Diagnosis of  By: Elease Hashimoto MD, Sadiq Mccauley    . GERD  (gastroesophageal reflux disease)   . 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 APPENDECTOMY     age 48  . INGUINAL HERNIA REPAIR Right 08/06/2015   Procedure: RIGHT INGUINAL HERNIA REPAIR WITH MESH;  Surgeon: Coralie Keens, MD;  Location: [place];  Service: General;  Laterality: Right;  . INSERTION OF MESH Right 08/06/2015   Procedure: INSERTION OF MESH;  Surgeon: Coralie Keens, MD;  Location: [place];  Service: General;  Laterality: Right;    Family History  Problem Relation Age of Onset  . Alcohol abuse Neg Hx        family hx  . Heart disease Neg Hx        family hx    SOCIAL HX: She was smoking.   Current Outpatient Medications:  .  ASPIRIN 81 PO, Take 162 mg by mouth daily as needed (pain)., Disp: , Rfl:  .  citalopram (CELEXA) 10 MG tablet, Take 1 tablet (10 mg total) by mouth daily., Disp: 90 tablet, Rfl: 1 .  donepezil (ARICEPT) 10 MG tablet, Take 1 tablet (10 mg total) by mouth at bedtime. (Patient not taking: Reported on 09/24/2018), Disp: 30 tablet, Rfl: 11 .  LORazepam (ATIVAN) 0.5 MG tablet, Take one tablet every 6 hours as needed for extreme agitation., Disp: 30 tablet, Rfl: 2 .  memantine (NAMENDA) 10 MG tablet, Take 1 tablet (10 mg total) by mouth 2 (two) times daily. (Patient not taking: Reported on 08/04/2016), Disp: 60 tablet, Rfl: 11 .  risperiDONE (RISPERDAL) 0.25 MG tablet, Take 1 tablet (0.25 mg total) by mouth at bedtime., Disp: 90 tablet, Rfl: 1  EXAM:  VITALS per patient if applicable:  GENERAL: alert, oriented, appears well and in no acute distress  HEENT: atraumatic, conjunttiva clear, no obvious abnormalities on inspection of external nose and ears  NECK: normal movements of the head and neck  LUNGS: on inspection no signs of respiratory distress, breathing rate appears normal, no obvious gross SOB,  gasping or wheezing  CV: no obvious cyanosis  MS: moves all visible extremities without noticeable abnormality  PSYCH/NEURO: pleasant and cooperative, no obvious depression or anxiety, speech and thought processing grossly intact  ASSESSMENT AND PLAN:  Discussed the following assessment and plan:  Dementia with behavioral disturbance  -Discussed nonpharmacologic factors.  They will try to remove the car from premises to hopefully reduce his fixation on driving  -Try to keep engaged in activities such as puzzles and checkers and thinks he is familiar with as much as possible during the day  -Increase Risperdal to 0.25 mg twice daily  -Increase Celexa to 20 mg once daily  -continue lorazepam as needed for severe agitation  -We will touch base next week to see how things are going.     I discussed the assessment and treatment plan with the patient. The patient was provided an opportunity to ask questions and all were answered. The patient agreed with the plan and demonstrated an understanding of the instructions.   The patient was advised to call back or seek an in-person evaluation if the symptoms worsen or if the condition fails to improve as anticipated.  I provided 25 minutes of non-face-to-face time during this encounter.  Greater than 50% spent discussing safety issues, medication issues, future placement, nonpharmacologic management of agitation   Carolann Littler, MD

## 2018-10-06 NOTE — Telephone Encounter (Signed)
Will discuss at meeting with them today 1:15

## 2018-10-19 NOTE — Telephone Encounter (Signed)
[name] stated her husband is still being very agitated with her and violent. Wife want to know if pt's med's need to be adjusted. Pt is very confused. Please call wife to advise.

## 2018-10-19 NOTE — Telephone Encounter (Signed)
Please see message. °

## 2018-10-20 ENCOUNTER — Other Ambulatory Visit: Payer: Self-pay

## 2018-10-20 DIAGNOSIS — G309 Alzheimer's disease, unspecified: Secondary | ICD-10-CM

## 2018-10-20 DIAGNOSIS — F0281 Dementia in other diseases classified elsewhere with behavioral disturbance: Secondary | ICD-10-CM

## 2018-10-20 MED ORDER — RISPERIDONE 0.5 MG PO TABS: 0.5000 mg | ORAL_TABLET | Freq: Two times a day (BID) | ORAL | 1 refills | Status: AC

## 2018-10-20 NOTE — Telephone Encounter (Signed)
Referral for [facility] has been sent. [facility] [address]  Called patients wife and gave her the update. Wife verbalized an understanding.

## 2018-10-20 NOTE — Telephone Encounter (Signed)
Increase the Risperdal to 0.5 mg twice daily.  I am not sure what other facilities exist that may be more accomodating to his behavioral issues.  This may be something [facility] social work can assist with?    Let's see if they can assist.

## 2018-10-23 ENCOUNTER — Other Ambulatory Visit: Payer: Self-pay | Admitting: Family Medicine

## 2018-10-23 NOTE — Telephone Encounter (Signed)
Refill #30 one every 6 to 8 hours as needed for extreme agitation.

## 2018-10-23 NOTE — Telephone Encounter (Signed)
OK to continue? Dosage? Refills?

## 2018-10-24 NOTE — Telephone Encounter (Signed)
Please send this through your control system. Thanks!

## 2018-10-24 NOTE — Telephone Encounter (Signed)
Lorazepam refill sent.

## 2018-10-25 ENCOUNTER — Other Ambulatory Visit: Payer: Self-pay

## 2018-10-25 NOTE — Patient Outreach (Signed)
[facility]) Care Management  10/25/2018  Dennis Harper 1940-10-24 [id number]   TELEPHONE SCREENING Referral date: 10/20/18 Referral source: primary MD Referral reason: facility placement Insurance: Medicare  Telephone call to patient's spouse and designated party release, Dennis Harper.  HIPAA verified by spouse for patient. RNCM introduced herself and explained reason for call. Wife states she would like have patient placed in a nursing facility for care and management. Spouse states patient has dementia which has progressed.  Wife states, "sometimes patient is not sure who I am. "  Wife states patient is fairly lucid at times then not all.  Wife reports patient can feed himself. She states she has to assist patient with dressing.  Wife states patient has fallen approximately 6 times within the past 3 months. She denies patient using an ambulatory device. Spouse states she has had to call the police for assistance with patient in the past. Spouse states their son live approximately 10 minutes away from them and has had to come to the home when patient has been difficult to manage. Spouse states she has [facility] agency providing services Monday through Friday from 10am to [place] discussed and offered [facility] care management services. Spouse verbally agreed to follow up with social worker  PLAN: RNCM will refer patient to social worker   Quinn Plowman RN,BSN,CCM [facility] Telephonic  [phone]

## 2018-11-01 ENCOUNTER — Other Ambulatory Visit: Payer: Self-pay | Admitting: *Deleted

## 2018-11-01 NOTE — Patient Outreach (Signed)
[facility]) Care Management  11/01/2018  Dennis Harper 02/26/1941 [id number]   CSW had received referral from patient's PCP, Dr. Carolann Littler to assist with [facility] placement. CSW called & spoke with patient's wife/designated party release, Dennis Harper who informed CSW that patient had progressed to where she is not able to manage him at home anymore. Patient's wife had hired caregivers to come into their home 5 days a week from [place] to walk with him outside, play games to engage him but that once he sundowns in the late afternoons and evening, she can not handle him. Patient had been started on Celexa after his last PCP appointment - patient's wife reports some improvement but is still concerned about getting him to actually go to the ALF. They had taken him to [facility] and signed all the paperwork, but then he refused to stay. Patient's wife states that she had come up with a plan to tell him that it would just be temporary until they are done fixing their bathroom, but she's unsure whether that plan would work. CSW called & spoke with Pam at [facility] to confirm that everything had been completed in order for him to move in - Pam states that they would need an updated FL2 with the medication changes and possibly an updated PPD. CSW called & spoke with patient's son, Dennis Harper [phone] who informed CSW that he was under the impression that [facility] would not work out and that they would need to explore other options. Son to discuss with his mom & call CSW back with the plan. CSW will check back in 1 week if no call back.    Raynaldo Opitz, LCSW [facility]  Clinical Social Worker cell #: [phone]

## 2018-11-08 ENCOUNTER — Other Ambulatory Visit: Payer: Self-pay | Admitting: *Deleted

## 2018-11-10 NOTE — Patient Outreach (Signed)
[facility]) Care Management  11/10/2018  Dennis Harper 09/08/1940 [id number]   CSW received call from patient's wife informing that patient had an "episode" Monday, 5/25 where he got agitated and told his wife that he wanted her to leave him alone, patient ended up calling the police and patient's son was called to calm patient down. CSW spoke with patient's son as well who states that he believes his mother tends to exaggerate things but he was able to calm him down and things seemed to be ok. CSW provided information to patient's son on a new ALF/Memory Care Unit that is being built on [place], [place] at [facility]. Patient's son states that location would be most convenient as he lives off [address] and his mother lives off [address] and would be easily accessible via [employer]. CSW provided contact at [facility], Arnold Long and has scheduled 2:00 tour today. Gae Bon did explain to CSW that their memory care is just waiting licensure and they are anticipating July 1st opening. CSW will follow-up in 2 weeks if no call in the meantime.    Raynaldo Opitz, LCSW [facility]  Clinical Social Worker cell #: [phone]

## 2018-11-13 ENCOUNTER — Telehealth: Payer: Self-pay | Admitting: *Deleted

## 2018-11-13 NOTE — Telephone Encounter (Signed)
Please see message. °

## 2018-11-13 NOTE — Telephone Encounter (Signed)
Copied from [place] [id number]. Topic: General - Other >> Nov 13, 2018  9:49 AM Carolyn Stare wrote: Pt wife req a call back concerning pt medication  she does not think the below medication is working for the pt    citalopram (CELEXA) 10 MG tablet      LORazepam (ATIVAN) 0.5 MG tablet    risperiDONE (RISPERDAL) 0.5 MG tablet

## 2018-11-13 NOTE — Telephone Encounter (Signed)
Spoke with his wife.  She never increased the Citalopram and will increase this to 20 mg daily.

## 2018-11-24 ENCOUNTER — Ambulatory Visit: Payer: Self-pay | Admitting: *Deleted

## 2018-11-28 ENCOUNTER — Other Ambulatory Visit: Payer: Self-pay | Admitting: Family Medicine

## 2018-11-28 NOTE — Telephone Encounter (Signed)
**  Patient's wife calling and states that the pharmacy will not fill these prescriptions. States that on the Citalopram they told her they could not fill it for 90 days from fill date (09/27/2018), but Dr Elease Hashimoto ok'd patient to take differently than prescribed. On the Lorazepam, states that pharmacy told her that they could not refill prescription until 03/26/2019. Please advise. **  Medication Refill - Medication: citalopram (CELEXA) 10 MG tablet LORazepam (ATIVAN) 0.5 MG tablet    Has the patient contacted their pharmacy? Yes.   (Agent: If no, request that the patient contact the pharmacy for the refill.) (Agent: If yes, when and what did the pharmacy advise?)  Preferred Pharmacy (with phone number or street name[id number] [place], [address]  Agent: Please be advised that RX refills may take up to 3 business days. We ask that you follow-up with your pharmacy.

## 2018-11-28 NOTE — Telephone Encounter (Signed)
Change Celexa to 20 mg tablet and take one tablet daily #90 with 3 refills. Refill Lorazepam 0.5 mg one po q 8 hours prn extreme agitation #60 with 2 refills.

## 2018-11-28 NOTE — Telephone Encounter (Signed)
Please see message. °

## 2018-11-29 ENCOUNTER — Other Ambulatory Visit: Payer: Self-pay

## 2018-11-29 MED ORDER — LORAZEPAM 0.5 MG PO TABS: ORAL_TABLET | ORAL | 3 refills | Status: DC

## 2018-11-29 MED ORDER — CITALOPRAM HYDROBROMIDE 20 MG PO TABS: 20.0000 mg | ORAL_TABLET | Freq: Every day | ORAL | 3 refills | Status: DC

## 2018-11-29 NOTE — Telephone Encounter (Signed)
I have sent in the Celexa. Please send the Lorazepam as it is controlled. Thank you.

## 2018-12-12 ENCOUNTER — Telehealth: Payer: Self-pay

## 2018-12-12 NOTE — Telephone Encounter (Signed)
Copied from [place] [id number]. Topic: General - Other >> Dec 12, 2018  9:43 AM Antonieta Iba C wrote: Reason for CRM: Maggie with [facility] is requesting the pt's cog native testing to be sent to them as well. Burman Nieves says that she received the Medical Records but not testing.   Fax: [phone] policy [phone] -   Phone:  Burman Nieves [phone]

## 2018-12-18 NOTE — Telephone Encounter (Signed)
Please see message. °

## 2018-12-18 NOTE — Telephone Encounter (Signed)
See visit 10/11/17.  He score 15/30 (Severe) at that time.  He has gotten agitated and not co-operated with testing since then- and really very limited usefulness in testing after this severity.  We could send 10/11/17 note.

## 2018-12-19 ENCOUNTER — Ambulatory Visit: Payer: Self-pay | Admitting: *Deleted

## 2018-12-19 ENCOUNTER — Encounter (HOSPITAL_COMMUNITY): Payer: Self-pay

## 2018-12-19 ENCOUNTER — Emergency Department (HOSPITAL_COMMUNITY): Payer: Medicare Other

## 2018-12-19 ENCOUNTER — Emergency Department (HOSPITAL_COMMUNITY)
Admission: EM | Admit: 2018-12-19 | Discharge: 2018-12-20 | Disposition: A | Discharge status: Home / Self Care | Payer: Medicare Other | Attending: Emergency Medicine | Admitting: Emergency Medicine

## 2018-12-19 ENCOUNTER — Other Ambulatory Visit: Payer: Self-pay

## 2018-12-19 DIAGNOSIS — R55 Syncope and collapse: Secondary | ICD-10-CM | POA: Insufficient documentation

## 2018-12-19 DIAGNOSIS — R531 Weakness: Secondary | ICD-10-CM | POA: Diagnosis not present

## 2018-12-19 DIAGNOSIS — G309 Alzheimer's disease, unspecified: Secondary | ICD-10-CM | POA: Diagnosis not present

## 2018-12-19 DIAGNOSIS — R404 Transient alteration of awareness: Secondary | ICD-10-CM | POA: Diagnosis not present

## 2018-12-19 DIAGNOSIS — I959 Hypotension, unspecified: Secondary | ICD-10-CM | POA: Diagnosis not present

## 2018-12-19 HISTORY — DX: Dementia in other diseases classified elsewhere, unspecified severity, without behavioral disturbance, psychotic disturbance, mood disturbance, and anxiety: F02.80

## 2018-12-19 HISTORY — DX: Anxiety disorder, unspecified: F41.9

## 2018-12-19 LAB — CBC WITH DIFFERENTIAL/PLATELET
Abs Immature Granulocytes: 0.01 10*3/uL (ref 0.00–0.07)
Basophils Absolute: 0 10*3/uL (ref 0.0–0.1)
Basophils Relative: 1 %
Eosinophils Absolute: 0 10*3/uL (ref 0.0–0.5)
Eosinophils Relative: 1 %
HCT: 35.1 % — ABNORMAL LOW (ref 39.0–52.0)
Hemoglobin: 11.3 g/dL — ABNORMAL LOW (ref 13.0–17.0)
Immature Granulocytes: 0 %
Lymphocytes Relative: 21 %
Lymphs Abs: 1.4 10*3/uL (ref 0.7–4.0)
MCH: 30.5 pg (ref 26.0–34.0)
MCHC: 32.2 g/dL (ref 30.0–36.0)
MCV: 94.6 fL (ref 80.0–100.0)
Monocytes Absolute: 0.5 10*3/uL (ref 0.1–1.0)
Monocytes Relative: 7 %
Neutro Abs: 4.7 10*3/uL (ref 1.7–7.7)
Neutrophils Relative %: 70 %
Platelets: 181 10*3/uL (ref 150–400)
RBC: 3.71 MIL/uL — ABNORMAL LOW (ref 4.22–5.81)
RDW: 13.1 % (ref 11.5–15.5)
WBC: 6.6 10*3/uL (ref 4.0–10.5)
nRBC: 0 % (ref 0.0–0.2)

## 2018-12-19 LAB — COMPREHENSIVE METABOLIC PANEL
ALT: 14 U/L (ref 0–44)
AST: 17 U/L (ref 15–41)
Albumin: 3.8 g/dL (ref 3.5–5.0)
Alkaline Phosphatase: 34 U/L — ABNORMAL LOW (ref 38–126)
Anion gap: 11 (ref 5–15)
BUN: 19 mg/dL (ref 8–23)
CO2: 24 mmol/L (ref 22–32)
Calcium: 8.7 mg/dL — ABNORMAL LOW (ref 8.9–10.3)
Chloride: 104 mmol/L (ref 98–111)
Creatinine, Ser: 0.99 mg/dL (ref 0.61–1.24)
GFR calc Af Amer: >60 mL/min (ref >60)
GFR calc non Af Amer: >60 mL/min (ref >60)
Glucose, Bld: 93 mg/dL (ref 70–99)
Potassium: 4.2 mmol/L (ref 3.5–5.1)
Sodium: 139 mmol/L (ref 135–145)
Total Bilirubin: 0.5 mg/dL (ref 0.3–1.2)
Total Protein: 6.6 g/dL (ref 6.5–8.1)

## 2018-12-19 MED ORDER — LORAZEPAM 2 MG/ML IJ SOLN: 1.0000 mg | Freq: Once | INTRAMUSCULAR | Status: DC

## 2018-12-19 MED ORDER — RISPERIDONE 0.5 MG PO TABS
0.5000 mg | ORAL_TABLET | Freq: Once | ORAL | Status: AC
  Administered 2018-12-19: 18:00:00 0.5 mg via ORAL
  Filled 2018-12-19: qty 1

## 2018-12-19 MED ORDER — LORAZEPAM 1 MG PO TABS
1.0000 mg | ORAL_TABLET | Freq: Once | ORAL | Status: AC
  Administered 2018-12-19: 18:00:00 1 mg via ORAL
  Filled 2018-12-19: qty 1

## 2018-12-19 NOTE — Progress Notes (Addendum)
CSW received a call back from non-emergency dispatch stating they (EMS) were able to obtain the following:  Wife's correct name Dennis Harper # Ph: [phone]  .. and Address:   [address] [place], [place] [address].  CSW called and was informed by pt's wife that she is refusing (she stated, "no") to pick up the pt because he "will beat the hell out of me because he thinks I sent him to the ED, which I didn't, I only did what the doctor told me to do and brought him there then".  [name] explained that she believes that if the pt "has a good night's sleep and his meds he will be fine tomorrow".  Pt stated the above twice and stated further that tomorrow morning the pt would "not remember a thing" due to his dementia and alztheimers.  [name] stated she had stated this to the pt's EPD earlier.  [name] stated that the pt's caregiver is at the pt's home from 10am-6pm every day, but that after 6pm "I am all alone with this".  [name] states, "We have a placement for him but they aren't open yet".  [name] stated she already made a down payment on the pt's room but they (the facility) doesn't have their paperwork "completed and back from the state yet".  [name] stated she is 72 years old and afraid for her life if pt returns tonight and states pt's dementia has progressed severely in the past two months. [name] is very apologetic but states again she is fearful for her life if she takes him home tonight before he forgets what has happened, as he usually he does once he sleeps through the night.  CSW will continue to follow for D/C needs.  Alphonse Guild. Antwan Bribiesca, LCSW, LCAS, CSI Transitions of Care Clinical Social Worker Care Coordination Department Ph: [phone]

## 2018-12-19 NOTE — ED Triage Notes (Signed)
Pt BIBA from home. Pt has been having increased weakness x 1 -2 weeks. Wife had been getting BP with incorrect cuff and had lower readings, wanted him evaluated Near syncopal episode in the yard today.  Hx of alzheimers.

## 2018-12-19 NOTE — ED Notes (Signed)
Patient ambulated without difficulty.  RN has attempted to call patient's emergency contact multiple times. Pt's contact is no answering and only one number is Furniture conservator/restorer for his wife. Other number in patient's chart is "not in service"

## 2018-12-19 NOTE — Progress Notes (Addendum)
CSW spoke to non-emergency dispatch who stated an officer went to the address and was told that the pt's wife does not reside there by the person or persons there.  5:34 PM CSW requested a call back from EMS to determine if they had a record of the pt's/pt's wife's correct address.  CSW will continue to follow for D/C needs.  Alphonse Guild. Attie Nawabi, LCSW, LCAS, CSI Transitions of Care Clinical Social Worker Care Coordination Department Ph: [phone]

## 2018-12-19 NOTE — ED Notes (Signed)
This nurse reached out to social work d/t nursing unable to reach patient contacts- spoke with Angelica Chessman, reports he is actively working on this and will touch back with this nurse.

## 2018-12-19 NOTE — Progress Notes (Signed)
Received Dennis Harper in his room asleep with 4 point soft restraints on and the sitter is at the bedside. One limb was released and the other upper limb was released. He became combative, lashing out attempting hit this Probation officer, unable to follow directions and very disorganized. The restraints were reapplied and the lights were turned out. He eventually calmed down with his eyes closed. He continued to have a difficult time following directions and demanding. He was repositioned in the bed and his restraints assessed for circulation.

## 2018-12-19 NOTE — Progress Notes (Signed)
EPD updated and CSW to call EPD back at [facility] request shortly.  CSW will continue to follow for D/C needs.  Alphonse Guild. Gwynne Kemnitz, LCSW, LCAS, CSI Transitions of Care Clinical Social Worker Care Coordination Department Ph: [phone]

## 2018-12-19 NOTE — ED Notes (Signed)
Pt was so combative we was unable to get urine sample

## 2018-12-19 NOTE — ED Notes (Signed)
Patient came into TCU escorted by 4-5 security officers.  Patient combative hitting and kicking at officers and nursing staff. Undressed patient and put him in burgundy scrubs.  Patient continues to try to get away and swing at security.  Patient placed in safety restraints.  MD Ralene Bathe notified.

## 2018-12-19 NOTE — Telephone Encounter (Signed)
Documentation has been faxed to the number provided.

## 2018-12-19 NOTE — Progress Notes (Signed)
CSW was made aware by ED Secretary, Lattie Haw, that she, RN and EDP have been unable to contact patient's emergency contact who is to believed to be his wife, to get collateral information on patient. CSW also attempted to call the numbers listed and the call does not go through. CSW called non-emergency police to do a welfare check at the home to possibly locate patient's emergency contact. CSW left CSW ED number for call back. CSW will continue to follow up.   Golden Circle, LCSW Transitions of Care Department [facility] ED [phone]

## 2018-12-19 NOTE — Care Management (Signed)
CM consult reviewed. CSW has addressed need. Venita Sheffield RN CCM

## 2018-12-19 NOTE — Telephone Encounter (Signed)
[name] called regarding her husband having dizziness this morning and having b/p pressure of 92/49 and HR of 65. Asked to recheck the b/p now and it is 79/48. Pt is weak and did c/o some chest pain earlier per wife but none now. Advised her to call 911, she voiced understanding. LB at [facility] notified of wife calling 911. Routing to the practice for review. Triage not completed.  Reason for Disposition . [5] Systolic BP < 90 AND [1] dizzy, lightheaded, or weak  Answer Assessment - Initial Assessment Questions 1. BLOOD PRESSURE: "What is the blood pressure?" "Did you take at least two measurements 5 minutes apart?"     92/49 about 45 mins ago and now 79/48 HR 62 2. ONSET: "When did you take your blood pressure?"     This morning 3. HOW: "How did you obtain the blood pressure?" (e.g., visiting nurse, automatic home BP monitor)     *No Answer* 4. HISTORY: "Do you have a history of low blood pressure?" "What is your blood pressure normally?"     *No Answer* 5. MEDICATIONS: "Are you taking any medications for blood pressure?" If yes: "Have they been changed recently?"     *No Answer* 6. PULSE RATE: "Do you know what your pulse rate is?"      *No Answer* 7. OTHER SYMPTOMS: "Have you been sick recently?" "Have you had a recent injury?"     *No Answer* 8. PREGNANCY: "Is there any chance you are pregnant?" "When was your last menstrual period?"     *No Answer*  Protocols used: LOW BLOOD PRESSURE-A-AH

## 2018-12-19 NOTE — Progress Notes (Signed)
CSW updated EPD who stated EPD is aware pt's wife is refusing to pick up pt due to safety reasons involving sundowning/dementia and that a caregiver will be with pt's wife to assist with pt in the AM.  Per pt's wife she will be able to p/u the pt anytime after 5am tomorrow.  [name] aware [name] will be calling her at ph: [phone].  RN updated  CSW will continue to follow for D/C needs.  Alphonse Guild. Cathren Sween, LCSW, LCAS, CSI Transitions of Care Clinical Social Worker Care Coordination Department Ph: [phone]

## 2018-12-19 NOTE — ED Notes (Signed)
Patient transported to CT 

## 2018-12-19 NOTE — ED Provider Notes (Signed)
[facility] DEPT Provider Note   CSN: [id number] Arrival date & time: 12/19/18  1257    History   Chief Complaint Chief Complaint  Patient presents with   Near Syncope    HPI Dennis Harper is a 78 y.o. male.     The history is provided by the patient and medical records. No language interpreter was used.   Dennis Harper is a 78 y.o. male who presents to the Emergency Department complaining of near syncope.  Level V caveat due to dementia.  Per pt he is in the ED for unknown reasons. Prior EMS reports the patient had a near syncopal event yesterday while working outside in the yard. His wife checked his blood pressure today and felt that it was slow. EMS had normal vital signs. Past Medical History:  Diagnosis Date   Alzheimer's dementia ([place])    Anxiety     There are no active problems to display for this patient.   History reviewed. No pertinent surgical history.      Home Medications    Prior to Admission medications   Not on File    Family History No family history on file.  Social History Social History   Tobacco Use   Smoking status: Not on file  Substance Use Topics   Alcohol use: Not on file   Drug use: Not on file     Allergies   Patient has no known allergies.   Review of Systems Review of Systems  All other systems reviewed and are negative.    Physical Exam Updated Vital Signs BP 113/78    Pulse 77    Temp 98.3 F (36.8 C) (Oral)    Resp 18    SpO2 100%   Physical Exam Vitals signs and nursing note reviewed.  Constitutional:      Appearance: He is well-developed.  HENT:     Head: Normocephalic and atraumatic.  Cardiovascular:     Rate and Rhythm: Normal rate and regular rhythm.  Pulmonary:     Effort: Pulmonary effort is normal. No respiratory distress.  Abdominal:     Palpations: Abdomen is soft.     Tenderness: There is no abdominal tenderness. There is no guarding or rebound.    Musculoskeletal:        General: No tenderness.  Skin:    General: Skin is warm and dry.  Neurological:     Mental Status: He is alert.     Comments: Disoriented two recent events and location. Five out of five strength in all four extremities.  Psychiatric:     Comments: Anxious and agitated      ED Treatments / Results  Labs (all labs ordered are listed, but only abnormal results are displayed) Labs Reviewed  COMPREHENSIVE METABOLIC PANEL - Abnormal; Notable for the following components:      Result Value   Calcium 8.7 (*)    Alkaline Phosphatase 34 (*)    All other components within normal limits  CBC WITH DIFFERENTIAL/PLATELET - Abnormal; Notable for the following components:   RBC 3.71 (*)    Hemoglobin 11.3 (*)    HCT 35.1 (*)    All other components within normal limits  URINE CULTURE  URINALYSIS, ROUTINE W REFLEX MICROSCOPIC    EKG EKG Interpretation  Date/Time:  Tuesday December 19 2018 13:23:07 EDT Ventricular Rate:  63 PR Interval:    QRS Duration: 92 QT Interval:  433 QTC Calculation: 444 R Axis:   76 Text  Interpretation:  Sinus rhythm Confirmed by Quintella Reichert [phone]) on 12/19/2018 2:27:13 PM   Radiology Ct Head Wo Contrast  Result Date: 12/19/2018 CLINICAL DATA:  Weakness for 2 days, near syncopal episode today EXAM: CT HEAD WITHOUT CONTRAST TECHNIQUE: Contiguous axial images were obtained from the base of the skull through the vertex without intravenous contrast. COMPARISON:  None. FINDINGS: Brain: Mild atrophic and chronic white matter ischemic changes are noted. No findings to suggest acute hemorrhage, acute infarction or space-occupying mass lesion are noted. Vascular: No hyperdense vessel or unexpected calcification. Skull: Normal. Negative for fracture or focal lesion. Sinuses/Orbits: No acute finding. Other: None IMPRESSION: Chronic atrophic and ischemic changes without acute abnormality. Electronically Signed   By: Inez Catalina M.D.   On: 12/19/2018  18:39    Procedures Procedures (including critical care time)  Medications Ordered in ED Medications  risperiDONE (RISPERDAL) tablet 0.5 mg (0.5 mg Oral Given 12/19/18 1819)  LORazepam (ATIVAN) tablet 1 mg (1 mg Oral Given 12/19/18 1819)     Initial Impression / Assessment and Plan / ED Course  I have reviewed the triage vital signs and the nursing notes.  Pertinent labs & imaging results that were available during my care of the patient were reviewed by me and considered in my medical decision making (see chart for details).        Patient with history of Alzheimer's here for evaluation following a near syncopal event. Patient was in the emergency department five hours before being able to reach patient's wife. She states that he was with the caretaker outside and when he came in he fell to the ground. It is unclear if he hit his head but he was believed to have lost consciousness. She states that his blood pressure for her was low. On EMS arrival he was not hypotensive. He is not had any hypotension in the emergency department. She states that she will be unable to take him home tonight as she does not feel safe caring for him given that he gets agitated at night. She will be able to pick him up in the morning after he sleeps tonight. Plan to observe in the emergency department with hopes that she will pick him up in the morning. In terms of the syncopal event with transient hypotension, no recurrent hypertension here. Presentation is not consistent with cardiogenic syncope, PE, ACS, dissection.  Final Clinical Impressions(s) / ED Diagnoses   Final diagnoses:  None    ED Discharge Orders    None       Quintella Reichert, MD 12/20/18 [phone]

## 2018-12-20 ENCOUNTER — Ambulatory Visit: Payer: Self-pay | Admitting: *Deleted

## 2018-12-20 ENCOUNTER — Encounter: Payer: Self-pay | Admitting: *Deleted

## 2018-12-20 DIAGNOSIS — R55 Syncope and collapse: Secondary | ICD-10-CM | POA: Diagnosis not present

## 2018-12-20 MED ORDER — LORAZEPAM 0.5 MG PO TABS
0.5000 mg | ORAL_TABLET | Freq: Four times a day (QID) | ORAL | Status: DC | PRN
  Administered 2018-12-20: 02:00:00 0.5 mg via ORAL
  Filled 2018-12-20: qty 1

## 2018-12-20 MED ORDER — RISPERIDONE 0.5 MG PO TABS
0.5000 mg | ORAL_TABLET | Freq: Two times a day (BID) | ORAL | Status: DC
  Administered 2018-12-20: 0.5 mg via ORAL
  Filled 2018-12-20: qty 1

## 2018-12-20 NOTE — Discharge Instructions (Signed)
Make sure that he is drinking plenty of fluids, eating well and taking all of his medicines.  Call his doctor today for a follow-up appointment within the next several days.  Return here, if needed, for problems.

## 2018-12-20 NOTE — ED Notes (Signed)
Pt awake, agitated, trying to get oob. Took risperdal.

## 2018-12-20 NOTE — Telephone Encounter (Signed)
Called patients wife and she stated that she checked his BP again and it was 72/43 and his pulse was 85. She stated he is very very unstable and I advised her to call 911 and go back to the hospital per Dr. Elease Hashimoto. Wife verbalized an understanding and gave her correct MRN so it will be documented correctly.

## 2018-12-20 NOTE — ED Notes (Signed)
Refusing nutrition. Urinal offered.

## 2018-12-20 NOTE — Telephone Encounter (Signed)
Per chart pt was not evaluated in ED.

## 2018-12-20 NOTE — Telephone Encounter (Signed)
This encounter was created in error - please disregard.

## 2018-12-20 NOTE — Telephone Encounter (Signed)
Wonder if he had a psych admission?  I don't see admission to [facility] either.

## 2018-12-20 NOTE — Telephone Encounter (Signed)
Per wife calling pt's BP is 80/40. Pt went to the ER and they sent him back home. Please call pt.   Returned call to patient's wife. Stated that it went to the hospital but was there over night. Stated that they could not find anything wrong with him. She brought him home this morning. She tried to assist him to take a shower and he fell in the bath room. While I was talking the phone was disconnected and tried several time to call the wife back and was not able to get through. Routing to LB at [facility] for review and recommendation.

## 2018-12-20 NOTE — Telephone Encounter (Signed)
Called patient and left a message on home phone number.  Called son Lennette Bihari and he stated that his dad was admitted to [facility] on 12/19/18 and I looked but I do not see this in Epic for some reason??  Lennette Bihari stated that he or his mother will call for a follow up when patient is discharged.

## 2018-12-20 NOTE — Progress Notes (Addendum)
CSW spoke with patient's wife. She reports that she will come pick patient up today upon discharge. CSW notified RN.   Golden Circle, LCSW Transitions of Care Department [facility] ED [phone]

## 2018-12-20 NOTE — Telephone Encounter (Signed)
Wife called again and gave me the MRN from the paperwork from [facility] of MRN: [id number], they seem to have put on a different or previous chart and is not currently documented in his current active chart.  Patient was discharged from hospital and patient fell today and his BP was 70/39 and his pulse was 71 around 11am this morning. Patients wife has no instructions and she does not know what to do to help him. Patient is staggering around outside and is very unstable and they are having a hard time keeping him inside and she does not know what to do.   Please advise.

## 2018-12-20 NOTE — ED Notes (Signed)
Pt discharged to his wife. Belongings returned. Pt was calm and cooperative at the time of discharged. Escorted pt to his wife's car. He did recognize her.

## 2018-12-20 NOTE — ED Provider Notes (Signed)
8:45 AM-I was contacted by nursing staff who state that the patient's wife is on her way to pick him up in about 30 minutes.  He was seen overnight and evaluated for near syncope.  He was evaluated and treated and felt stable for discharge.   Daleen Bo, MD 12/20/18 [phone]

## 2018-12-20 NOTE — Telephone Encounter (Signed)
IF highly unstable with BP he might have to go back to ER.  He is not on any BP medications.  He really needs to be in memory care unit.

## 2018-12-22 ENCOUNTER — Telehealth: Payer: Self-pay | Admitting: Family Medicine

## 2018-12-22 ENCOUNTER — Ambulatory Visit: Payer: Self-pay | Admitting: *Deleted

## 2018-12-22 NOTE — Telephone Encounter (Signed)
Last 2 days patient had been agrivated, incontinent. Patient did not sleep last night- was agitated and is not coherent. He is very resistant to care and people around him. Patient is using fowl language. Call to office to see if patient can be schedule for evaluation of symptoms.   Reason for Disposition . [1] Longstanding confusion (e.g., dementia, stroke) AND [2] worsening  Answer Assessment - Initial Assessment Questions 1. LEVEL OF CONSCIOUSNESS: "How is he (she, the patient) acting right now?" (e.g., alert-oriented, confused, lethargic, stuporous, comatose)     Confused as to where he lives- has started wondering 3-4 weeks now. Patient was incoherent this morning 2. ONSET: "When did the confusion start?"  (minutes, hours, days)     Getting worse 3. PATTERN "Does this come and go, or has it been constant since it started?"  "Is it present now?"     Getting worse- wife is concerned about changes in speech 4. ALCOHOL or DRUGS: "Has he been drinking alcohol or taking any drugs?"      no 5. NARCOTIC MEDICATIONS: "Has he been receiving any narcotic medications?" (e.g., morphine, Vicodin)     no 6. CAUSE: "What do you think is causing the confusion?"      Possible stroke vs staging of dementia 7. OTHER SYMPTOMS: "Are there any other symptoms?" (e.g., difficulty breathing, headache, fever, weakness)     Incontinent, falling- weakness  Protocols used: CONFUSION - DELIRIUM-A-AH

## 2018-12-22 NOTE — Telephone Encounter (Signed)
He has severe dementia with behavioral disturbance and really needs to be in a care facility devoted to dementia patients.  If there is concern for acute delirium would have to be evaluated at hospital.  Otherwise, suggest [facility] social work input to re-explore options for nursing home care.

## 2018-12-22 NOTE — Telephone Encounter (Signed)
Please see separate note.

## 2018-12-22 NOTE — Telephone Encounter (Signed)
Patient wife called about patient. She thinks that his dementia is getting worst. The patient has been to the ED and patient wife is not happy with the ED discharging patient after taking BP. Patient wife is wanting the patient to be admitted to the hospital to get some test done and not have to go trough the ED. I did offer the patient a virtual visit with Dr. Elease Hashimoto and they did not want to do that. Please advise. Phone number is [phone]

## 2018-12-22 NOTE — Telephone Encounter (Signed)
Called patients wife and she stated that her husband has been urinating in the bed during the night and he is going downhill fast. His wife thinks he may have had a mini stroke and he is barely able to walk. Patients wife is not able to sleep and stated that she is not able to take this behavior.   Patient is wanting to know if he needs to come to see Dr. Elease Hashimoto and she does not know what to do. She is waiting for the memory unit to open up at [facility].   Wife states that she is not getting any help and she needs help. Patient wants her husband to be admitted into the hospital and have more extensive tests done.  Please advise.

## 2018-12-22 NOTE — Telephone Encounter (Signed)
Please see message.  Please advise. 

## 2018-12-25 ENCOUNTER — Encounter (HOSPITAL_COMMUNITY): Payer: Self-pay

## 2018-12-25 NOTE — Telephone Encounter (Signed)
This message has already been addressed in different telephone message.

## 2018-12-25 NOTE — Telephone Encounter (Signed)
Called patients wife and gave her the message from Dr. Elease Hashimoto and let her know to reach out to the [facility] again and let's see if we can have them evaluate her husband and see if there is a different facility that they recommend from his assessment. Patients wife verbalized an understanding and stated that she will reach out again and keep [place] informed.

## 2019-01-08 ENCOUNTER — Telehealth: Payer: Self-pay

## 2019-01-08 ENCOUNTER — Ambulatory Visit (INDEPENDENT_AMBULATORY_CARE_PROVIDER_SITE_OTHER): Payer: Medicare Other | Admitting: Family Medicine

## 2019-01-08 ENCOUNTER — Other Ambulatory Visit: Payer: Self-pay

## 2019-01-08 DIAGNOSIS — G309 Alzheimer's disease, unspecified: Secondary | ICD-10-CM

## 2019-01-08 DIAGNOSIS — F0281 Dementia in other diseases classified elsewhere with behavioral disturbance: Secondary | ICD-10-CM

## 2019-01-08 NOTE — Telephone Encounter (Signed)
Agree with setting up telephone or Doxy follow up.  If they think he has had acute stroke (ie < 24-48 hours) do advise ER evaluation.

## 2019-01-08 NOTE — Progress Notes (Signed)
Patient ID: Dennis Harper, male   DOB: 11-10-1940, 78 y.o.   MRN: [id number]  This visit type was conducted due to national recommendations for restrictions regarding the COVID-19 pandemic in an effort to limit this patient's exposure and mitigate transmission in our community.   Virtual Visit via Video Note  I connected with Dennis Harper on 01/08/19 at  3:30 PM EDT by a video enabled telemedicine application and verified that I am speaking with the correct person using two identifiers.  Location patient: home Location provider:work or home office Persons participating in the virtual visit: patient, provider, and patient's wife (as pt has severe dementia)  I discussed the limitations of evaluation and management by telemedicine and the availability of in person appointments. The patient expressed understanding and agreed to proceed.   HPI: Patient has advanced dementia with behavioral disturbance.  Wife and son are in process of trying to get him placed in memory care unit.  The one they are looking at is still waiting for full certification.  Wife had called earlier and there was message that she was concerned he had had a "stroke."  No reported recent slurred speech, focal weakness, or facial droop.  He has become more debilitated and dependant in care.    Still agitated at times.  Frequently wandering.  He is on Aricept, Celexa 20 mg qd, Ativan 0.5 mg bid, and Risperdal 0.5 mg bid.  Wife is fairly exhausted with his care.  No recent reported falls.  He has declining appetite.  We have had [facility] social work assisting with placement options.  Past Medical History:  Diagnosis Date  . Alzheimer's dementia ([place])   . Anxiety   . GALLSTONES 01/06/2010   Qualifier: Diagnosis of  By: Elease Hashimoto MD, Bruce    . GERD (gastroesophageal reflux disease)   . 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  APPENDECTOMY     age 76  . INGUINAL HERNIA REPAIR Right 08/06/2015   Procedure: RIGHT INGUINAL HERNIA REPAIR WITH MESH;  Surgeon: Coralie Keens, MD;  Location: [place];  Service: General;  Laterality: Right;  . INSERTION OF MESH Right 08/06/2015   Procedure: INSERTION OF MESH;  Surgeon: Coralie Keens, MD;  Location: [place];  Service: General;  Laterality: Right;    reports that he has been smoking cigars. He has never used smokeless tobacco. He reports current alcohol use. He reports that he does not use drugs. family history is not on file. No Known Allergies    ROS: See pertinent positives and negatives per HPI.  Past Medical History:  Diagnosis Date  . Alzheimer's dementia ([place])   . Anxiety   . GALLSTONES 01/06/2010   Qualifier: Diagnosis of  By: Elease Hashimoto MD, Bruce    . GERD (gastroesophageal reflux disease)   . 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 APPENDECTOMY     age 82  . INGUINAL HERNIA REPAIR Right 08/06/2015   Procedure: RIGHT INGUINAL HERNIA REPAIR WITH MESH;  Surgeon: Coralie Keens, MD;  Location: [place];  Service: General;  Laterality: Right;  . INSERTION OF MESH Right 08/06/2015   Procedure: INSERTION OF MESH;  Surgeon: Coralie Keens, MD;  Location: [place];  Service: General;  Laterality:  Right;    Family History  Problem Relation Age of Onset  . Alcohol abuse Neg Hx        family hx  . Heart disease Neg Hx        family hx    SOCIAL HX: long hx of cigarette use   Current Outpatient Medications:  .  ASPIRIN 81 PO, Take 162 mg by mouth daily as needed (pain)., Disp: , Rfl:  .  aspirin EC 81 MG tablet, Take 81 mg by mouth daily as needed for mild pain., Disp: , Rfl:  .  citalopram (CELEXA) 20 MG tablet, Take 20 mg by mouth daily., Disp: , Rfl:  .  donepezil (ARICEPT)  10 MG tablet, Take 1 tablet (10 mg total) by mouth at bedtime. (Patient not taking: Reported on 09/24/2018), Disp: 30 tablet, Rfl: 11 .  LORazepam (ATIVAN) 0.5 MG tablet, Take 0.5 mg by mouth every 8 (eight) hours as needed for anxiety., Disp: , Rfl:  .  memantine (NAMENDA) 10 MG tablet, Take 1 tablet (10 mg total) by mouth 2 (two) times daily. (Patient not taking: Reported on 08/04/2016), Disp: 60 tablet, Rfl: 11 .  risperiDONE (RISPERDAL) 0.5 MG tablet, Take 1 tablet (0.5 mg total) by mouth 2 (two) times daily., Disp: 180 tablet, Rfl: 1  EXAM:  VITALS per patient if applicable:  GENERAL: alert, oriented, appears well and in no acute distress  HEENT: atraumatic, conjunttiva clear, no obvious abnormalities on inspection of external nose and ears  NECK: normal movements of the head and neck  LUNGS: on inspection no signs of respiratory distress, breathing rate appears normal, no obvious gross SOB, gasping or wheezing  CV: no obvious cyanosis  MS: moves all visible extremities without noticeable abnormality  PSYCH/NEURO: pleasant and cooperative, no obvious depression or anxiety, speech and thought processing grossly intact  ASSESSMENT AND PLAN:  Discussed the following assessment and plan:  Alzheimer's Dementia with behavioral disturbance.   -continue Celexa and prn lorazepam -increase qhs Risperdal to 1 mg and continue with 0.5 mg q AM. -placement efforts are under way for him.     I discussed the assessment and treatment plan with the patient. The patient was provided an opportunity to ask questions and all were answered. The patient agreed with the plan and demonstrated an understanding of the instructions.   The patient was advised to call back or seek an in-person evaluation if the symptoms worsen or if the condition fails to improve as anticipated.    Carolann Littler, MD

## 2019-01-08 NOTE — Telephone Encounter (Signed)
Standley Brooking, LCSW  [place], [place], [place],   I just heard back from [facility] - they said they did awesome last week with [facility] & said 2-4 weeks for licensure. I gave Mrs. Lezcano a call to let her know that they are going to start setting up assessments and such - she's going to give [facility] a call to get that scheduled. I asked about the FL2, but they said to hold off just yet due to the date needing to be in compliance.    This is from [place] with [facility]. I copied and pasted for records in his chart.

## 2019-01-08 NOTE — Telephone Encounter (Signed)
Called and spoke to patients wife and she has an appointment today at 3:30pm for a telephone visit. Patients wife thinks he has had a stroke. I advised to her that he needs to be evaluated at the ER since we do not have that kind of equipment to help diagnose any changes in him. I also asked her if she has reached out to [facility] and she stated that she did and spoke to [employer] with [facility] and has not heard back from her. I will reach out to her today.  Copied from [place] [phone]. Topic: Appointment Scheduling - Scheduling Inquiry for Clinic >> Jan 08, 2019  7:24 AM Scherrie Gerlach wrote: Reason for CRM: wife would like pt an appt with Dr Elease Hashimoto TODAY. She says pt's dementia is worse and she wants dr to look at him.  She says he cannot even dress himself anymore. Please call

## 2019-01-08 NOTE — Telephone Encounter (Signed)
Called son Dennis Harper to go over what happened today on the telephone: Best number Dennis Harper- [phone]  Dr. Elease Hashimoto sent a Doxy invite 2 times to email: [email or web address] and this was not the correct email. The correct email should be [email or web address].  Myself and Dr. Elease Hashimoto tried to call the patients home number 2 times also and no one answered.  I received a message from [place]: No she is on the phone yelling and demanding for Dr. Elease Hashimoto to get on the phone right now and stop wasting peoples time. Would you like to talk with them?   I picked up the line they were parked on and Dennis Harper was very upset and she was not very nice stating that she wanted to speak to Dr. Elease Hashimoto immediately and she was not going to tolerate [place] wasting her time. I let her know immediately that we will not tolerate her acting like that and if she was not going to be nice we would have to dismiss her and her husband due to her behavior. She has acted like this in the past and it will not be allowed by our office. I then asked her what she would prefer, a telephone call or an email and she stated email. Her grandson Dennis Harper was there using the computer and I spoke to him to confirm the information. He confirmed and I let him know that if there were any complications then Dr. Elease Hashimoto will contact the home number listed as [phone]. Dennis Harper verbalized an understanding.

## 2019-01-09 ENCOUNTER — Telehealth: Payer: Self-pay

## 2019-01-09 ENCOUNTER — Other Ambulatory Visit: Payer: Self-pay | Admitting: *Deleted

## 2019-01-09 NOTE — Telephone Encounter (Signed)
Noted.  Not sure anything else needs to be done at this time.  Getting them any additional resources will be helpful.

## 2019-01-09 NOTE — Telephone Encounter (Signed)
I sent a message to [facility] and this is the response I received:   Standley Brooking, LCSW  Marcheta Horsey, Judith Part, CMA        Frazier Butt,   I've been working with Mrs. Gartrell for a few weeks now on finding placement for him and since COVID, we've been strictly telephonic. If needed, I can get a nurse in to evaluate him but the [place] that he is going to be going to in 2-4 weeks ([facility]) is planning on going out and assessing him so I'm not sure what more [facility] would be able to provide. I've talked with Mrs. Nims about getting in additional resources (24 hour care, as opposed to the few hours a day they currently get) but she has declined that and plans to wait it out until he's able to move into the ALF.    I wanted to let you know that [facility] Billings has been in communication with Mrs. Cullen.

## 2019-01-09 NOTE — Patient Outreach (Signed)
[facility]) Care Management  01/09/2019  Breccan Galant 1941/04/19 [id number]   CSW called to follow-up with patient's wife, Elvin So regarding placement for her husband. Ernestine informed CSW that her & her son have toured & put down a deposit for the Assisted Living Facility/locked Memory Care Unit at [facility], but that they are waiting to be told when he could move in. CSW called to clarify with [facility]ae [facility] at [facility] that because the building is brand new, they have been waiting for the state to come out for licensure, but they had come out last week and everything went well - now they are just waiting for to hear back when the residents can move in but they are hoping for 2-4 weeks. CSW also asked [facility] about the FL2, but they said to hold off just yet due to the date needing to be in compliance. CSW informed Mrs. Rorke to let her know that they are going to start setting up assessments and such - she's going to give Gae Bon a call to get that scheduled. CSW spoke with Mrs. Mcnairy about getting in additional resources (24 hour care, as opposed to the current schedule they have - Monday through Friday, 10am - 5pm) but she has declined that and plans to wait it out until he's able to move into the ALF. CSW will close case at this time as no further CSW needs identified. CSW encouraged patient's wife to call should any further needs arise. CSW communicated with Dr. Erick Blinks CMA, Jinny Blossom to make her aware of plans.    Raynaldo Opitz, LCSW [facility]  Clinical Social Worker cell #: [phone]

## 2019-01-10 ENCOUNTER — Telehealth: Payer: Self-pay | Admitting: Family Medicine

## 2019-01-10 NOTE — Telephone Encounter (Signed)
Called patient and spoke to his wife and gave her the new instructions. She verbalized an understanding. Wife is having a really hard time and she is looking forward to [place] opening up to accept her husband.

## 2019-01-10 NOTE — Telephone Encounter (Signed)
He is already on multiple medications.  Increase the Risperdal to 1 mg bid.  He is already on Celexa and lorazepam as well.
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
## 2019-01-10 NOTE — Telephone Encounter (Signed)
Please see message. °
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
## 2019-01-24 ENCOUNTER — Telehealth: Payer: Self-pay | Admitting: Family Medicine

## 2019-01-24 NOTE — Telephone Encounter (Signed)
FL2 forms placed in red folder on your desk. Please return when complete. Thank you!  I also looked in the system to find the recent FL2 form that was filled out and they have not scanned it yet.

## 2019-01-24 NOTE — Telephone Encounter (Signed)
done

## 2019-01-24 NOTE — Telephone Encounter (Signed)
Dennis Harper brought in a FL2 form to have completed for the patient. I asked her to fill out our form that we attach the the forms for a paper trail. She said that she is not taking anything else out. I told her that I'm not giving her anything to take I just needed her to fill the form out so we know what to do with the form when it is completed.This form just needs to go to [name] or [name]. I was trying to look up the patients name from the note that she wrote and was not able to find the name in the system because she said that it's not for her that it is for her husband and she blurted out his name. I told her that I wasn't trying to upset her for asking all these questions trying to figure out information for the form. She said that she is already upset. She just wants to make sure that [name] get these forms in there hands today. Since I wasn't able to get information about the patient in a sensable tone I just told her that I will give the forms to [name] and I told her to have a nice day. I took the forms and placed them on [name]'s Desk.

## 2019-01-24 NOTE — Telephone Encounter (Signed)
Called patient and spoke to wife and let her know the completed forms are at the front desk ready for pick up. I also let her know that in the future if she has paperwork to have filled out to please fill out the form at the front desk so we can complete the forms in the appropriate manner. Patient verbalized an understanding.

## 2019-01-26 ENCOUNTER — Ambulatory Visit: Payer: Self-pay

## 2019-01-26 NOTE — Telephone Encounter (Signed)
Will monitor for ED arrival.  

## 2019-01-26 NOTE — Telephone Encounter (Signed)
Incoming call from wife of Patient who complains about Patient fainting twice.   The second time Patient bumped his head is now bleeding. Happened today.  About 30 minutes ago. Possibly sat outside to long.   Patient has alzheimer Dx .  Patient wanders around all day every day .  CMA states that she thinks he sat out side to long.  Denies cardiac issues and lung issues.  Reviewed Protocol with Wife and care taker. Recommended Patient goes to ED for Evaluation. Wife and CMA voiced understating.   Reason for Disposition . Fainted 2 times in one day  Answer Assessment - Initial Assessment Questions 1. ONSET: "How long were you unconscious?" (minutes) "When did it happen?"     Sat in the sun for a little while maybe to long.   2. CONTENT: "What happened during period of unconsciousness?" (e.g., seizure activity)      Wife think so 3. MENTAL STATUS: "Alert and oriented now?" (oriented x 3 = name, month, location)      Has alzhiemers 4. TRIGGER: "What do you think caused the fainting?" "What were you doing just before you fainted?"  (e.g., exercise, sudden standing up, prolonged standing)      Wants to wander around  5. RECURRENT SYMPTOM: "Have you ever passed out before?" If so, ask: "When was the last time?" and "What happened that time?"      Yes every day.   6. INJURY: "Did you sustain any injury during the fall?"     Denies  7. CARDIAC SYMPTOMS: "Have you had any of the following symptoms: chest pain, difficulty breathing, palpitations?"     Exhaustion and heat.  8. NEUROLOGIC SYMPTOMS: "Have you had any of the following symptoms: headache, numbness, vertigo, weakness?"    Sit directly in the sun 9. GI SYMPTOMS: "Have you had any of the following symptoms: abdominal pain, vomiting, diarrhea, blood in stools?"     denies 10. OTHER SYMPTOMS: "Do you have any other symptoms?"       11. PREGNANCY: "Is there any chance you are pregnant?" "When was your last menstrual period?"        na  Protocols used: [facility]

## 2019-01-29 ENCOUNTER — Ambulatory Visit: Payer: Self-pay

## 2019-02-01 ENCOUNTER — Emergency Department (HOSPITAL_COMMUNITY)
Admission: EM | Admit: 2019-02-01 | Discharge: 2019-02-02 | Disposition: A | Discharge status: Home / Self Care | Payer: Medicare Other | Attending: Emergency Medicine | Admitting: Emergency Medicine

## 2019-02-01 ENCOUNTER — Encounter (HOSPITAL_COMMUNITY): Payer: Self-pay

## 2019-02-01 ENCOUNTER — Ambulatory Visit: Payer: Self-pay | Admitting: *Deleted

## 2019-02-01 ENCOUNTER — Emergency Department (HOSPITAL_COMMUNITY): Payer: Medicare Other

## 2019-02-01 ENCOUNTER — Other Ambulatory Visit: Payer: Self-pay

## 2019-02-01 DIAGNOSIS — F17210 Nicotine dependence, cigarettes, uncomplicated: Secondary | ICD-10-CM | POA: Diagnosis not present

## 2019-02-01 DIAGNOSIS — Z7982 Long term (current) use of aspirin: Secondary | ICD-10-CM | POA: Insufficient documentation

## 2019-02-01 DIAGNOSIS — R410 Disorientation, unspecified: Secondary | ICD-10-CM | POA: Diagnosis not present

## 2019-02-01 DIAGNOSIS — Z20828 Contact with and (suspected) exposure to other viral communicable diseases: Secondary | ICD-10-CM | POA: Diagnosis not present

## 2019-02-01 DIAGNOSIS — F0281 Dementia in other diseases classified elsewhere with behavioral disturbance: Secondary | ICD-10-CM | POA: Insufficient documentation

## 2019-02-01 DIAGNOSIS — R4182 Altered mental status, unspecified: Secondary | ICD-10-CM | POA: Diagnosis not present

## 2019-02-01 DIAGNOSIS — F419 Anxiety disorder, unspecified: Secondary | ICD-10-CM | POA: Diagnosis not present

## 2019-02-01 DIAGNOSIS — R9431 Abnormal electrocardiogram [ECG] [EKG]: Secondary | ICD-10-CM | POA: Diagnosis not present

## 2019-02-01 DIAGNOSIS — R456 Violent behavior: Secondary | ICD-10-CM | POA: Diagnosis present

## 2019-02-01 DIAGNOSIS — R404 Transient alteration of awareness: Secondary | ICD-10-CM | POA: Diagnosis not present

## 2019-02-01 DIAGNOSIS — Z79899 Other long term (current) drug therapy: Secondary | ICD-10-CM | POA: Diagnosis not present

## 2019-02-01 DIAGNOSIS — Z03818 Encounter for observation for suspected exposure to other biological agents ruled out: Secondary | ICD-10-CM | POA: Diagnosis not present

## 2019-02-01 DIAGNOSIS — G309 Alzheimer's disease, unspecified: Secondary | ICD-10-CM | POA: Insufficient documentation

## 2019-02-01 LAB — CBC WITH DIFFERENTIAL/PLATELET
Abs Immature Granulocytes: 0.01 10*3/uL (ref 0.00–0.07)
Basophils Absolute: 0 10*3/uL (ref 0.0–0.1)
Basophils Relative: 1 %
Eosinophils Absolute: 0 10*3/uL (ref 0.0–0.5)
Eosinophils Relative: 0 %
HCT: 35.3 % — ABNORMAL LOW (ref 39.0–52.0)
Hemoglobin: 10.9 g/dL — ABNORMAL LOW (ref 13.0–17.0)
Immature Granulocytes: 0 %
Lymphocytes Relative: 21 %
Lymphs Abs: 1.3 10*3/uL (ref 0.7–4.0)
MCH: 29.6 pg (ref 26.0–34.0)
MCHC: 30.9 g/dL (ref 30.0–36.0)
MCV: 95.9 fL (ref 80.0–100.0)
Monocytes Absolute: 0.5 10*3/uL (ref 0.1–1.0)
Monocytes Relative: 8 %
Neutro Abs: 4.3 10*3/uL (ref 1.7–7.7)
Neutrophils Relative %: 70 %
Platelets: 187 10*3/uL (ref 150–400)
RBC: 3.68 MIL/uL — ABNORMAL LOW (ref 4.22–5.81)
RDW: 13.4 % (ref 11.5–15.5)
WBC: 6.2 10*3/uL (ref 4.0–10.5)
nRBC: 0 % (ref 0.0–0.2)

## 2019-02-01 LAB — RAPID URINE DRUG SCREEN, HOSP PERFORMED
Amphetamines: NOT DETECTED
Barbiturates: NOT DETECTED
Benzodiazepines: NOT DETECTED
Cocaine: NOT DETECTED
Opiates: NOT DETECTED
Tetrahydrocannabinol: NOT DETECTED

## 2019-02-01 LAB — URINALYSIS, ROUTINE W REFLEX MICROSCOPIC
Bilirubin Urine: NEGATIVE
Glucose, UA: NEGATIVE mg/dL
Hgb urine dipstick: NEGATIVE
Ketones, ur: 5 mg/dL — AB
Leukocytes,Ua: NEGATIVE
Nitrite: NEGATIVE
Protein, ur: NEGATIVE mg/dL
Specific Gravity, Urine: 1.021 (ref 1.005–1.030)
pH: 5 (ref 5.0–8.0)

## 2019-02-01 LAB — SARS CORONAVIRUS 2 BY RT PCR (HOSPITAL ORDER, PERFORMED IN ~~LOC~~ HOSPITAL LAB): SARS Coronavirus 2: NEGATIVE

## 2019-02-01 LAB — COMPREHENSIVE METABOLIC PANEL
ALT: 14 U/L (ref 0–44)
AST: 19 U/L (ref 15–41)
Albumin: 4 g/dL (ref 3.5–5.0)
Alkaline Phosphatase: 43 U/L (ref 38–126)
Anion gap: 10 (ref 5–15)
BUN: 24 mg/dL — ABNORMAL HIGH (ref 8–23)
CO2: 25 mmol/L (ref 22–32)
Calcium: 8.9 mg/dL (ref 8.9–10.3)
Chloride: 105 mmol/L (ref 98–111)
Creatinine, Ser: 0.98 mg/dL (ref 0.61–1.24)
GFR calc Af Amer: >60 mL/min (ref >60)
GFR calc non Af Amer: >60 mL/min (ref >60)
Glucose, Bld: 107 mg/dL — ABNORMAL HIGH (ref 70–99)
Potassium: 3.8 mmol/L (ref 3.5–5.1)
Sodium: 140 mmol/L (ref 135–145)
Total Bilirubin: 0.5 mg/dL (ref 0.3–1.2)
Total Protein: 7 g/dL (ref 6.5–8.1)

## 2019-02-01 LAB — ETHANOL: Alcohol, Ethyl (B): <10 mg/dL (ref <10)

## 2019-02-01 MED ORDER — ACETAMINOPHEN 325 MG PO TABS: 650.0000 mg | ORAL_TABLET | ORAL | Status: DC | PRN

## 2019-02-01 MED ORDER — LORAZEPAM 0.5 MG PO TABS: 0.5000 mg | ORAL_TABLET | ORAL | Status: DC | PRN

## 2019-02-01 MED ORDER — LORAZEPAM 2 MG/ML IJ SOLN
1.0000 mg | Freq: Once | INTRAMUSCULAR | Status: AC
  Administered 2019-02-01: 1 mg via INTRAMUSCULAR
  Filled 2019-02-01: qty 1

## 2019-02-01 MED ORDER — CITALOPRAM HYDROBROMIDE 10 MG PO TABS
10.0000 mg | ORAL_TABLET | Freq: Every day | ORAL | Status: DC
  Administered 2019-02-02 (×2): 10 mg via ORAL
  Filled 2019-02-01 (×2): qty 1

## 2019-02-01 MED ORDER — ONDANSETRON HCL 4 MG PO TABS: 4.0000 mg | ORAL_TABLET | Freq: Three times a day (TID) | ORAL | Status: DC | PRN

## 2019-02-01 MED ORDER — ZIPRASIDONE MESYLATE 20 MG IM SOLR
10.0000 mg | Freq: Once | INTRAMUSCULAR | Status: AC
  Administered 2019-02-01: 10 mg via INTRAMUSCULAR
  Filled 2019-02-01: qty 20

## 2019-02-01 MED ORDER — STERILE WATER FOR INJECTION IJ SOLN
INTRAMUSCULAR | Status: AC
  Administered 2019-02-01: 19:00:00 1 mL
  Filled 2019-02-01: qty 10

## 2019-02-01 MED ORDER — ALUM & MAG HYDROXIDE-SIMETH 200-200-20 MG/5ML PO SUSP: 30.0000 mL | Freq: Four times a day (QID) | ORAL | Status: DC | PRN

## 2019-02-01 MED ORDER — RISPERIDONE 0.5 MG PO TABS
0.5000 mg | ORAL_TABLET | Freq: Two times a day (BID) | ORAL | Status: DC
  Administered 2019-02-02 (×2): 0.5 mg via ORAL
  Filled 2019-02-01 (×2): qty 1

## 2019-02-01 NOTE — ED Notes (Signed)
Portable xray at bedside.

## 2019-02-01 NOTE — ED Notes (Signed)
Pt resting with safety sitter at bedside.
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
## 2019-02-01 NOTE — ED Notes (Signed)
Pt has safety sitter at bedside. Pt ambulating in room.
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
## 2019-02-01 NOTE — ED Notes (Signed)
EKg given to EDP,[place].MD., for review.
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
## 2019-02-02 ENCOUNTER — Telehealth: Payer: Self-pay | Admitting: Family Medicine

## 2019-02-02 DIAGNOSIS — G309 Alzheimer's disease, unspecified: Secondary | ICD-10-CM | POA: Diagnosis not present

## 2019-02-02 MED ORDER — ZIPRASIDONE MESYLATE 20 MG IM SOLR
10.0000 mg | Freq: Once | INTRAMUSCULAR | Status: AC
  Administered 2019-02-02: 10 mg via INTRAMUSCULAR
  Filled 2019-02-02: qty 20

## 2019-02-02 MED ORDER — RISPERIDONE 1 MG PO TABS
1.0000 mg | ORAL_TABLET | Freq: Two times a day (BID) | ORAL | Status: DC
  Administered 2019-02-02: 18:00:00 1 mg via ORAL
  Filled 2019-02-02: qty 1
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
## 2019-02-02 NOTE — Progress Notes (Addendum)
CSW called pt's wife [phone] who stated pt will be placed at a facility ([facility] [address] in [place] in [facility]) on 8/31.    [name] asked about a plan in case pt were to act out in the future and the CSW outlined the increase in medications (Risperdal) prescribed pt by psychiatry to assist with potential sundowning.  CSW also encouraged pt's wife not to hesitate to call 911 or Emergency Services if pt begins to act out and to not hesitate to being him back if the pt's wife feels that she believes pt is a danger to himself or others.  [name] voiced understanding and stated she would arrive in approx 25 minutes.  RN/EDP updated.  CSW will continue to follow for D/C needs.  Alphonse Guild. Maxamilian Amadon, LCSW, LCAS, CSI Transitions of Care Clinical Social Worker Care Coordination Department Ph: [phone]

## 2019-02-02 NOTE — Progress Notes (Signed)
CSW to gather collateral from pt's wife via phone call.  CSW will continue to follow for D/C needs.  Alphonse Guild. Kailan Laws, LCSW, LCAS, CSI Transitions of Care Clinical Social Worker Care Coordination Department Ph: [phone]
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
## 2019-02-02 NOTE — Discharge Instructions (Addendum)
Patient cleared by behavioral health for discharge home.  Had 2 evaluations.

## 2019-02-02 NOTE — ED Notes (Signed)
[name] called and got an update on the pt. Wife states again that she doesn't feel safe with the pt and her home and thinks that we need to keep pt.  Wife's states that her home number is the best number to reach her.  The phone number has been updated as it was initially incorrectly entered.

## 2019-02-02 NOTE — BH Assessment (Signed)
[facility] Assessment Progress Note    02/01/19: To be seen later this date, patient cannot render any information due to current altered mental state. This writer attempted to contact his wife to gather collateral information unsuccessfully.

## 2019-02-02 NOTE — ED Notes (Signed)
Spoke SW to follow-up with wife and MD

## 2019-02-02 NOTE — Telephone Encounter (Signed)
If she feels he is at imminent threat to himself or her or others then agree with him being sent to hospital.  Hopefully this can expedite his admission.  He has advanced dementia with some major behavioral concerns and is on multiple medications to try to help with his agitation
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
## 2019-02-02 NOTE — Telephone Encounter (Signed)
I do not see that this patient was at [facility] yesterday? Are you able to see this?
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
## 2019-02-02 NOTE — Progress Notes (Signed)
Pt has been psychiatrically cleared by Jinny Blossom, NP. CSW has attempted to contact pt's spouse several times but no one answers and her voice mail has not been set up to leave a message. Disposition will continue to attempt to reach out to Mrs. Mccullers.   Audree Camel, LCSW, [place] Disposition [facility]/TTS [phone]
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
## 2019-02-02 NOTE — ED Notes (Signed)
Pt took medications with applesauce.

## 2019-02-02 NOTE — Telephone Encounter (Signed)
Please see phone message below
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
## 2019-02-02 NOTE — Telephone Encounter (Signed)
Please see message. °
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
## 2019-02-02 NOTE — ED Notes (Signed)
Pt ambulating in the hallway escorted by the sitter. Pt has a steady gait.

## 2019-02-02 NOTE — ED Notes (Signed)
Dr. Vanita Panda made aware that pt got up out of bed and swung at staff when they were trying to escort pt back to bed.  Pt was encouraged to take his medications but refused. Pt currently in bed but states that he wants to "go on his merry way."  Sitter and tech at bedside to ensure pt safety.

## 2019-02-02 NOTE — Progress Notes (Signed)
Pt continually attempting to leave his room and walk down the hallway of the ED and is impeding stretcher/bed traffic.  Pt redirected down the hallway away from his room by the pt's sitter in order to assist traffic in continuing in the hallway, this is despite pt's recent geodone injection.  Pt now being allowed to ambulate in the hallway due to pt's refusal to reenter his room.  CSW will continue to follow for D/C needs.  Alphonse Guild. Keenon Leitzel, LCSW, LCAS, CSI Transitions of Care Clinical Social Worker Care Coordination Department Ph: [phone]

## 2019-02-02 NOTE — Progress Notes (Signed)
CSW reviewed chart and sees pt is psychiatrically cleared as "there is nothing further psychiatry can do to mitigate the symptoms of advanced dementia".  CSW will continue to follow for D/C needs.  Alphonse Guild. Maurina Fawaz, LCSW, LCAS, CSI Transitions of Care Clinical Social Worker Care Coordination Department Ph: [phone]

## 2019-02-02 NOTE — Telephone Encounter (Signed)
The [facility] ED made him a new chart........Marland Kitchen MRN: [id number]

## 2019-02-02 NOTE — ED Notes (Signed)
Pt was found sitting in the bed. Writer of this note asked pt if he needed to used that bathroom, pt stated yes. While unhooking pt from equipment, pt urinated on himself. Pt was cleaned up, placed clean purple scrubs and a brief was placed on pt. Clothing and shoes were placed in pt belonging bags.

## 2019-02-02 NOTE — ED Provider Notes (Signed)
Patient cleared by behavioral health for discharge home x2.  Also social worker involved.  Patient's wife will take him home.  He does have nursing home placement at the end of the month on August 31.  Patient did receive some Geodon here about an hour ago he still awake and functional.  He will continue his current medications.   Fredia Sorrow, MD 02/02/19 [phone]

## 2019-02-02 NOTE — Progress Notes (Addendum)
Patient ID: Dennis Harper, male   DOB: [birth year], 78 y.o.   MRN: [id number]  Pt's Risperdal was increased by Dr Mariea Clonts in order to help his sundowning. He has advanced dementia. He has placement pending in a memory care unit on 02/12/2019. Pt has been seen and cleared by psychiatry earlier today. Social work is involved to facilitate transfer back to the wife's care. Wife is refusing to pick up Pt even after being informed that Pt's are not held in the ED to await placement. At this point there is nothing further psychiatry can do to mitigate the symptoms of advanced dementia. Additional TTS consult is not necessary at this time.   Ethelene Hal, PMHNP 02/02/2019        1726   Patient's chart reviewed. Reviewed the information documented and agree with the treatment plan.  Buford Dresser, DO 02/05/19 2:43 PM

## 2019-02-02 NOTE — BH Assessment (Signed)
[place] [facility] Assessment Progress Note   Patient is too sleepy to be assessed at this time.  TTS to complete assessment when patient is awake and alert.

## 2019-02-05 ENCOUNTER — Encounter (HOSPITAL_COMMUNITY): Payer: Self-pay

## 2019-02-07 ENCOUNTER — Other Ambulatory Visit: Payer: Self-pay

## 2019-02-07 ENCOUNTER — Emergency Department (HOSPITAL_COMMUNITY)
Admission: EM | Admit: 2019-02-07 | Discharge: 2019-02-08 | Disposition: A | Discharge status: Skilled Nursing Facility | Payer: Medicare Other | Attending: Emergency Medicine | Admitting: Emergency Medicine

## 2019-02-07 ENCOUNTER — Telehealth: Payer: Self-pay | Admitting: Family Medicine

## 2019-02-07 DIAGNOSIS — Z79899 Other long term (current) drug therapy: Secondary | ICD-10-CM | POA: Insufficient documentation

## 2019-02-07 DIAGNOSIS — F17200 Nicotine dependence, unspecified, uncomplicated: Secondary | ICD-10-CM | POA: Insufficient documentation

## 2019-02-07 DIAGNOSIS — R0902 Hypoxemia: Secondary | ICD-10-CM | POA: Diagnosis not present

## 2019-02-07 DIAGNOSIS — R531 Weakness: Secondary | ICD-10-CM | POA: Insufficient documentation

## 2019-02-07 DIAGNOSIS — G309 Alzheimer's disease, unspecified: Secondary | ICD-10-CM | POA: Diagnosis not present

## 2019-02-07 DIAGNOSIS — R404 Transient alteration of awareness: Secondary | ICD-10-CM | POA: Diagnosis not present

## 2019-02-07 DIAGNOSIS — F99 Mental disorder, not otherwise specified: Secondary | ICD-10-CM | POA: Insufficient documentation

## 2019-02-07 DIAGNOSIS — Z7982 Long term (current) use of aspirin: Secondary | ICD-10-CM | POA: Diagnosis not present

## 2019-02-07 DIAGNOSIS — Z20828 Contact with and (suspected) exposure to other viral communicable diseases: Secondary | ICD-10-CM | POA: Insufficient documentation

## 2019-02-07 DIAGNOSIS — F0281 Dementia in other diseases classified elsewhere with behavioral disturbance: Secondary | ICD-10-CM

## 2019-02-07 DIAGNOSIS — S299XXA Unspecified injury of thorax, initial encounter: Secondary | ICD-10-CM | POA: Diagnosis not present

## 2019-02-07 DIAGNOSIS — E860 Dehydration: Secondary | ICD-10-CM | POA: Diagnosis not present

## 2019-02-07 DIAGNOSIS — R456 Violent behavior: Secondary | ICD-10-CM | POA: Diagnosis not present

## 2019-02-07 DIAGNOSIS — R4689 Other symptoms and signs involving appearance and behavior: Secondary | ICD-10-CM | POA: Diagnosis present

## 2019-02-07 DIAGNOSIS — Z03818 Encounter for observation for suspected exposure to other biological agents ruled out: Secondary | ICD-10-CM | POA: Diagnosis not present

## 2019-02-07 MED ORDER — ONDANSETRON HCL 4 MG PO TABS: 4.0000 mg | ORAL_TABLET | Freq: Three times a day (TID) | ORAL | Status: DC | PRN

## 2019-02-07 MED ORDER — LORAZEPAM 2 MG/ML IJ SOLN
2.0000 mg | Freq: Once | INTRAMUSCULAR | Status: AC
  Administered 2019-02-07: 15:00:00 2 mg via INTRAMUSCULAR
  Filled 2019-02-07: qty 1

## 2019-02-07 MED ORDER — ALUM & MAG HYDROXIDE-SIMETH 200-200-20 MG/5ML PO SUSP: 30.0000 mL | Freq: Four times a day (QID) | ORAL | Status: DC | PRN

## 2019-02-07 MED ORDER — ACETAMINOPHEN 325 MG PO TABS: 650.0000 mg | ORAL_TABLET | ORAL | Status: DC | PRN

## 2019-02-07 MED ORDER — RISPERIDONE 0.5 MG PO TABS
0.5000 mg | ORAL_TABLET | Freq: Two times a day (BID) | ORAL | Status: DC
  Administered 2019-02-07 – 2019-02-08 (×3): 0.5 mg via ORAL
  Filled 2019-02-07 (×3): qty 1

## 2019-02-07 MED ORDER — LORAZEPAM 2 MG/ML IJ SOLN
2.0000 mg | Freq: Once | INTRAMUSCULAR | Status: AC
  Administered 2019-02-07: 19:00:00 2 mg via INTRAMUSCULAR

## 2019-02-07 MED ORDER — LORAZEPAM 0.5 MG PO TABS
0.5000 mg | ORAL_TABLET | Freq: Three times a day (TID) | ORAL | Status: DC | PRN
  Administered 2019-02-08: 0.5 mg via ORAL
  Filled 2019-02-07: qty 1

## 2019-02-07 MED ORDER — CITALOPRAM HYDROBROMIDE 10 MG PO TABS
20.0000 mg | ORAL_TABLET | Freq: Every day | ORAL | Status: DC
  Administered 2019-02-07 – 2019-02-08 (×2): 20 mg via ORAL
  Filled 2019-02-07 (×2): qty 2

## 2019-02-07 MED ORDER — LORAZEPAM 2 MG/ML IJ SOLN
2.0000 mg | Freq: Once | INTRAMUSCULAR | Status: DC
  Filled 2019-02-07: qty 1

## 2019-02-07 MED ORDER — LORAZEPAM 0.5 MG PO TABS: 0.5000 mg | ORAL_TABLET | Freq: Three times a day (TID) | ORAL | 5 refills | Status: AC | PRN

## 2019-02-07 NOTE — ED Notes (Signed)
Placed pt in abdominal binder

## 2019-02-07 NOTE — Progress Notes (Signed)
CSW called and updated  [facility] (Ed Tomasita Crumble) Executive Director's office at [phone] that pt's iwfe would be in his office at [place] to complete paperwork prior to pt d/c'ing to [facility] via [employer] at ph: [phone].  CSW will continue to follow for D/C needs.  Alphonse Guild. Kairi Tufo, LCSW, LCAS, CSI Transitions of Care Clinical Social Worker Care Coordination Department Ph: [phone]

## 2019-02-07 NOTE — Telephone Encounter (Signed)
I printed out rx of Lorazepam to fax over.

## 2019-02-07 NOTE — ED Notes (Signed)
Pt is alert and verbally responsive. Pt continues to have moments of restlessness, and requires constant redirection.

## 2019-02-07 NOTE — Telephone Encounter (Signed)
rachael calling from [facility] called and stated that they would need the rx LORazepam (ATIVAN) 0.5 MG tablet [id number] faxes over. She states that pt will be coming to them on Monday 02/12/19. Please advise    [phone]
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
## 2019-02-07 NOTE — Progress Notes (Addendum)
2nd shift ED CSW received a handoff from the 1st shift [facility] ED CSW.   CSW called Ed Insurance risk surveyor at [place] and asked the cost of respite care on a daily basis and was told the cost was:  $ 175 for private per day for a private  $ 145 Per day for a semi-private  Mr. Tomasita Crumble states that the wife will need to arrive at [address] at [facility] An office is right in the front door in the covered drive through after 9am with the cost of a private ($145) or a semi-private room ($175) X 4 days and will need to sign a contract prior to the pt coming to the facility.  Mr. Tomasita Crumble at [phone] will call the CSW after the contract is signed to facilitate admission to the facility on 02/08/19.  2nd shift ED CSW will leave handoff for 1st shift ED CSW.  CSW will continue to follow for D/C needs.  Alphonse Guild. Aideliz Garmany, LCSW, LCAS, CSI Transitions of Care Clinical Social Worker Care Coordination Department Ph: [phone]

## 2019-02-07 NOTE — Telephone Encounter (Signed)
Rx faxed over

## 2019-02-07 NOTE — ED Notes (Signed)
Warm Hand Off Completed. From [place] RN to [employer]
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
## 2019-02-07 NOTE — Progress Notes (Addendum)
PLAN: On the morning of 8/27 the 1st shift ED CSW will call [facility] (Assunta Gambles) Executive Director's office at [phone] after 9am pt can D/C to [facility]F in [place].  CSW will seek the pt;'s room # and # for report and provide to [name].  [name] once notified of the above can call report and D/C pt to [facility] and will call [facility] to transport the pt at ph: [phone].  RN: Please update pt's wife of impending D/C and confirm with her that pt's wife will not have to p/u the pt to transport the pt to [facility], thank you!  CN/RN updated.  CSW will continue to follow for D/C needs.  Alphonse Guild. Merrell Borsuk, LCSW, LCAS, CSI Transitions of Care Clinical Social Worker Care Coordination Department Ph: [phone]

## 2019-02-07 NOTE — ED Notes (Signed)
Pt placed in [place] for safety and began having agitated and eloping behavior.

## 2019-02-07 NOTE — Progress Notes (Addendum)
EDP updated by CSW and CSW stated pt's wife may take the pt back for one night.  EDP states EDP feels pt should remain overnight for safety of the pt and the pt's wife and D/C directly to [facility] for a four-day respite care stay.  Pt will be in the [facility] within their Memory Care Unit, prior to the pt's admission to [facility] for long term care on 8/31.  [name] states she will be at the [facility] (Ed Tomasita Crumble) Financial controller office at [phone] at [place] to sign the contract.  PLAN: On the morning of 8/27 the 1st shift ED CSW will call [facility] (Assunta Gambles) Executive Director's office at [phone] after 9am pt can D/C to [facility]F in [place].  CSW will seek the pt;'s room # and # for report and provide to [name].  [name] once notified of the above can call report and D/C pt to [facility] and will call [facility] to transport the pt at ph: [phone].  RN: Please update pt's wife of impending D/C and confirm with her that pt's wife will not have to p/u the pt to transport the pt to [facility], thank you!  2nd shift ED CSW will leave handoff for 1st shift ED CSW.  CSW will continue to follow for D/C needs.  Alphonse Guild. Letica Giaimo, LCSW, LCAS, CSI Transitions of Care Clinical Social Worker Care Coordination Department Ph: [phone]
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
## 2019-02-08 ENCOUNTER — Emergency Department (HOSPITAL_COMMUNITY): Payer: Medicare Other

## 2019-02-08 DIAGNOSIS — M2550 Pain in unspecified joint: Secondary | ICD-10-CM | POA: Diagnosis not present

## 2019-02-08 DIAGNOSIS — S299XXA Unspecified injury of thorax, initial encounter: Secondary | ICD-10-CM | POA: Diagnosis not present

## 2019-02-08 DIAGNOSIS — G309 Alzheimer's disease, unspecified: Secondary | ICD-10-CM | POA: Diagnosis not present

## 2019-02-08 DIAGNOSIS — Z7401 Bed confinement status: Secondary | ICD-10-CM | POA: Diagnosis not present

## 2019-02-08 DIAGNOSIS — F29 Unspecified psychosis not due to a substance or known physiological condition: Secondary | ICD-10-CM | POA: Diagnosis not present

## 2019-02-08 DIAGNOSIS — R456 Violent behavior: Secondary | ICD-10-CM | POA: Diagnosis not present

## 2019-02-08 LAB — SARS CORONAVIRUS 2 BY RT PCR (HOSPITAL ORDER, PERFORMED IN ~~LOC~~ HOSPITAL LAB): SARS Coronavirus 2: NEGATIVE

## 2019-02-08 NOTE — ED Provider Notes (Signed)
  Provider Note MRN:  [id number]  Arrival date & time: 02/08/19    ED Course and Medical Decision Making  Assumed care from Default provider at shift change.  Patient evaluated overnight so that we can set up respite services, set up by case management/social work.  Appropriate for discharge.  Final Clinical Impressions(s) / ED Diagnoses     ICD-10-CM   1. Alzheimer's dementia with behavioral disturbance, unspecified timing of dementia onset ([place])  G30.9    F02.81   2. Psychiatric illness  F99 DG Chest Port 1 View    DG Chest Port 1 View    CANCELED: DG Chest 2 View    CANCELED: DG Chest 2 View    ED Discharge Orders    None        Discharge Instructions     You were evaluated in the Emergency Department and after careful evaluation, we did not find any emergent condition requiring admission or further testing in the hospital.  Please return to the Emergency Department if you experience any worsening of your condition.  We encourage you to follow up with a primary care provider.  Thank you for allowing [place] to be a part of your care.     Barth Kirks. Sedonia Small, [place] [email or web address]    Maudie Flakes, MD 02/08/19 1341

## 2019-02-08 NOTE — ED Notes (Signed)
Down time completed paper charts turned in to Med record.

## 2019-02-08 NOTE — Progress Notes (Addendum)
2:00p CSW faxed needed information to [facility] and was given the approval for patient to be admitted into the facility. Patient will be going to room 401 and the number for report is [phone]. CSW completed paperwork for [facility] and call [facility] to set up transport. CSW faxed over AVS for updated medication list.    CSW called patient's wife to update her and spoke with her about the change in the transportation ([facility] vs [facility]). Patient's wife was agreeable and just wants patient to get the the facility safely.   11:34a CSW spoke with [place] with [facility] admission who stated that they need more information on patient that was not discussed with CSW spoke with Mr. Tomasita Crumble. Florina Ou reports they do need a updated COVID test and TB test. CSW informed Florina Ou that we can only complete a chest x-ray due to the emergent nature of patient needing to be d/c from the ED. CSW spoke with EDP who put in the orders for these and also spoke with RN to expedite getting results back.   11:26a CSW received confirmation that patient is has been accepted at [facility] for a short term respite stay. CSW to call [facility] for transport.   69:35a CSW contacted Ed Tomasita Crumble with [facility] to find out if patient's wife has completed paperwork. Per Ed, he spoke with patient's wife and she should be there in the next 30 minutes. Ed to follow up with CSW once this is complete to d/c patient and set up transportation with [facility]. CSW completed FL2 and faxed it over to [facility] as well as negative COVID results and current medications (confirmed medications with the EDP).  CSW will continue to follow up.   Golden Circle, LCSW Transitions of Care Department [facility] ED [phone]

## 2019-02-08 NOTE — NC FL2 (Signed)
[place] LEVEL OF CARE SCREENING TOOL     IDENTIFICATION  Patient Name: Dennis Harper Birthdate: 1941/05/24 Sex: male Admission Date (Current Location): 02/07/2019  [facility] and [place] Number:  Herbalist and Address:  [facility],  [place] [address], [place]      Provider Number: [id number]  Attending Physician Name and Address:  Default, Provider, MD  Relative Name and Phone Number:  BAYLEN, ALLUMS [facility]) [phone]    Current Level of Care: Hospital Recommended Level of Care: Other (Comment)(Respite/ALF) Prior Approval Number:    Date Approved/Denied:   PASRR Number:    Discharge Plan: Other (Comment)(Respite/ALF)    Current Diagnoses: Patient Active Problem List   Diagnosis Date Noted  . Acute bronchitis 04/30/2015  . Memory loss 04/16/2015  . Alzheimer's dementia ([place]) 09/16/2014  . Right inguinal hernia 08/23/2014  . GERD (gastroesophageal reflux disease) 01/12/2012  . 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 GALLSTONES 01/06/2010  . BENIGN POSITIONAL VERTIGO 12/20/2008  . PARESTHESIA 12/20/2008    Orientation RESPIRATION BLADDER Height & Weight     Self(has Alz/dementia)  Normal Incontinent Weight: 169 lb 12.1 oz (77 kg) Height:  5\' 9"  (175.3 cm)  BEHAVIORAL SYMPTOMS/MOOD NEUROLOGICAL BOWEL NUTRITION STATUS      Incontinent Diet  AMBULATORY STATUS COMMUNICATION OF NEEDS Skin   Limited Assist Verbally Normal                       Personal Care Assistance Level of Assistance  Bathing, Feeding, Dressing Bathing Assistance: Limited assistance Feeding assistance: Limited assistance Dressing Assistance: Limited assistance     Functional Limitations Info  Sight, Hearing, Speech Sight Info: Adequate Hearing Info: Adequate Speech Info: Adequate    SPECIAL CARE FACTORS FREQUENCY                       Contractures Contractures Info: Not present    Additional Factors Info  Code  Status, Allergies, Psychotropic Code Status Info: Full Allergies Info: NKA Psychotropic Info: citalopram (CELEXA) tablet 20 mg 20 mg, Oral, Daily ; risperiDONE (RISPERDAL) tablet 0.5 mg 0.5 mg, Oral, 2 times daily         Current Medications (02/08/2019):  This is the current hospital active medication list Current Facility-Administered Medications  Medication Dose Route Frequency Provider Last Rate Last Dose  . acetaminophen (TYLENOL) tablet 650 mg  650 mg Oral Q4H PRN Isla Pence, MD      . alum & mag hydroxide-simeth (MAALOX/MYLANTA) 200-200-20 MG/5ML suspension 30 mL  30 mL Oral Q6H PRN Isla Pence, MD      . citalopram (CELEXA) tablet 20 mg  20 mg Oral Daily Isla Pence, MD   20 mg at 02/08/19 0935  . LORazepam (ATIVAN) injection 2 mg  2 mg Intramuscular Once Isla Pence, MD      . LORazepam (ATIVAN) tablet 0.5 mg  0.5 mg Oral Q8H PRN Isla Pence, MD   0.5 mg at 02/08/19 0935  . ondansetron (ZOFRAN) tablet 4 mg  4 mg Oral Q8H PRN Isla Pence, MD      . risperiDONE (RISPERDAL) tablet 0.5 mg  0.5 mg Oral BID Isla Pence, MD   0.5 mg at 02/08/19 [id number]   Current Outpatient Medications  Medication Sig Dispense Refill  . aspirin EC 81 MG tablet Take 81 mg by mouth daily as needed for mild pain.    . citalopram (CELEXA) 20 MG tablet Take 20 mg by  mouth daily.    Marland Kitchen LORazepam (ATIVAN) 0.5 MG tablet Take 1 tablet (0.5 mg total) by mouth every 8 (eight) hours as needed for anxiety. 90 tablet 5  . LORazepam (ATIVAN) 0.5 MG tablet Take 0.5 mg by mouth every 8 (eight) hours as needed for agitation.    . risperiDONE (RISPERDAL) 0.5 MG tablet Take 1 tablet (0.5 mg total) by mouth 2 (two) times daily. 180 tablet 1  . ASPIRIN 81 PO Take 162 mg by mouth daily as needed (pain).    Marland Kitchen donepezil (ARICEPT) 10 MG tablet Take 1 tablet (10 mg total) by mouth at bedtime. (Patient not taking: Reported on 09/24/2018) 30 tablet 11  . memantine (NAMENDA) 10 MG tablet Take 1 tablet (10 mg  total) by mouth 2 (two) times daily. (Patient not taking: Reported on 08/04/2016) 60 tablet 11     Discharge Medications: Please see discharge summary for a list of discharge medications.  Relevant Imaging Results:  Relevant Lab Results:   Additional Information SS# [id number]  Janace Hoard, LCSW

## 2019-02-08 NOTE — Discharge Instructions (Addendum)
You were evaluated in the Emergency Department and after careful evaluation, we did not find any emergent condition requiring admission or further testing in the hospital. ° °Please return to the Emergency Department if you experience any worsening of your condition.  We encourage you to follow up with a primary care provider.  Thank you for allowing us to be a part of your care. °

## 2019-02-08 NOTE — NC FL2 (Signed)
[place] LEVEL OF CARE SCREENING TOOL     IDENTIFICATION  Patient Name: Dennis Harper Birthdate: 1940/07/20 Sex: male Admission Date (Current Location): 02/07/2019  [facility] and [place] Number:  Herbalist and Address:  [facility],  [place] [address] [place]      Provider Number: [id number]  Attending Physician Name and Address:  Default, Provider, MD  Relative Name and Phone Number:  NOTORIOUS, DOROW [facility]) [phone]    Current Level of Care: Hospital Recommended Level of Care: Other (Comment)(Respite/Memory Care) Prior Approval Number:    Date Approved/Denied:   PASRR Number:    Discharge Plan: Other (Comment)(Respite/Memory Care)    Current Diagnoses: Patient Active Problem List   Diagnosis Date Noted  . Acute bronchitis 04/30/2015  . Memory loss 04/16/2015  . Alzheimer's dementia ([place]) 09/16/2014  . Right inguinal hernia 08/23/2014  . GERD (gastroesophageal reflux disease) 01/12/2012  . 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 GALLSTONES 01/06/2010  . BENIGN POSITIONAL VERTIGO 12/20/2008  . PARESTHESIA 12/20/2008    Orientation RESPIRATION BLADDER Height & Weight     Self(has Alz/dementia)  Normal Incontinent Weight: 169 lb 12.1 oz (77 kg) Height:  5\' 9"  (175.3 cm)  BEHAVIORAL SYMPTOMS/MOOD NEUROLOGICAL BOWEL NUTRITION STATUS      Incontinent Diet(Regular)  AMBULATORY STATUS COMMUNICATION OF NEEDS Skin   Limited Assist Verbally Normal                       Personal Care Assistance Level of Assistance  Bathing, Feeding, Dressing Bathing Assistance: Limited assistance Feeding assistance: Limited assistance Dressing Assistance: Limited assistance     Functional Limitations Info  Sight, Hearing, Speech Sight Info: Adequate Hearing Info: Adequate Speech Info: Adequate    SPECIAL CARE FACTORS FREQUENCY                       Contractures Contractures Info: Not present     Additional Factors Info  Code Status, Allergies, Psychotropic Code Status Info: Full Allergies Info: NKA Psychotropic Info: citalopram (CELEXA) tablet 20 mg 20 mg, Oral, Daily ; risperiDONE (RISPERDAL) tablet 0.5 mg 0.5 mg, Oral, 2 times daily         Current Medications (02/08/2019):  This is the current hospital active medication list Current Facility-Administered Medications  Medication Dose Route Frequency Provider Last Rate Last Dose  . acetaminophen (TYLENOL) tablet 650 mg  650 mg Oral Q4H PRN Isla Pence, MD      . alum & mag hydroxide-simeth (MAALOX/MYLANTA) 200-200-20 MG/5ML suspension 30 mL  30 mL Oral Q6H PRN Isla Pence, MD      . citalopram (CELEXA) tablet 20 mg  20 mg Oral Daily Isla Pence, MD   20 mg at 02/08/19 0935  . LORazepam (ATIVAN) injection 2 mg  2 mg Intramuscular Once Isla Pence, MD      . LORazepam (ATIVAN) tablet 0.5 mg  0.5 mg Oral Q8H PRN Isla Pence, MD   0.5 mg at 02/08/19 0935  . ondansetron (ZOFRAN) tablet 4 mg  4 mg Oral Q8H PRN Isla Pence, MD      . risperiDONE (RISPERDAL) tablet 0.5 mg  0.5 mg Oral BID Isla Pence, MD   0.5 mg at 02/08/19 [id number]   Current Outpatient Medications  Medication Sig Dispense Refill  . aspirin EC 81 MG tablet Take 81 mg by mouth daily as needed for mild pain.    . citalopram (CELEXA) 20 MG tablet Take 20  mg by mouth daily.    Marland Kitchen LORazepam (ATIVAN) 0.5 MG tablet Take 1 tablet (0.5 mg total) by mouth every 8 (eight) hours as needed for anxiety. 90 tablet 5  . LORazepam (ATIVAN) 0.5 MG tablet Take 0.5 mg by mouth every 8 (eight) hours as needed for agitation.    . risperiDONE (RISPERDAL) 0.5 MG tablet Take 1 tablet (0.5 mg total) by mouth 2 (two) times daily. 180 tablet 1  . ASPIRIN 81 PO Take 162 mg by mouth daily as needed (pain).    Marland Kitchen donepezil (ARICEPT) 10 MG tablet Take 1 tablet (10 mg total) by mouth at bedtime. (Patient not taking: Reported on 09/24/2018) 30 tablet 11  . memantine (NAMENDA)  10 MG tablet Take 1 tablet (10 mg total) by mouth 2 (two) times daily. (Patient not taking: Reported on 08/04/2016) 60 tablet 11     Discharge Medications: Please see discharge summary for a list of discharge medications.  Relevant Imaging Results:  Relevant Lab Results:   Additional Information SS# [id number]  Janace Hoard, LCSW

## 2019-02-08 NOTE — Progress Notes (Signed)
02/08/2019  1437  Called [phone] to give report. LMOM. No answer. Will try again.

## 2019-02-08 NOTE — ED Notes (Signed)
ED TO INPATIENT HANDOFF REPORT  ED Nurse Name and Phone #: [phone]  S Name/Age/Gender Dennis Harper Reasons 78 y.o. male Room/Bed: WA29/WA29  Code Status   Code Status: Full Code  Home/SNF/Other Home Patient oriented to: self Is this baseline? Yes   Triage Complete: Triage complete  Chief Complaint weakness  Triage Note Per EMS, patient brought in from home. Wife unable to handle patient due to patient's dementia. Patient's wife stated to EMS that patient had a fall today and is weak and dehydrated. Patient is to be placed at [facility] Monday, however wife unwilling to keep patient at home until Monday. Vitals WNL    Allergies No Known Allergies  Level of Care/Admitting Diagnosis ED Disposition    None      B Medical/Surgery History Past Medical History:  Diagnosis Date  . Alzheimer's dementia ([place])   . Anxiety   . GALLSTONES 01/06/2010   Qualifier: Diagnosis of  By: Elease Hashimoto MD, Bruce    . GERD (gastroesophageal reflux disease)   . 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 APPENDECTOMY     age 14  . INGUINAL HERNIA REPAIR Right 08/06/2015   Procedure: RIGHT INGUINAL HERNIA REPAIR WITH MESH;  Surgeon: Coralie Keens, MD;  Location: [facility];  Service: General;  Laterality: Right;  . INSERTION OF MESH Right 08/06/2015   Procedure: INSERTION OF MESH;  Surgeon: Coralie Keens, MD;  Location: [place];  Service: General;  Laterality: Right;     A IV Location/Drains/Wounds Patient Lines/Drains/Airways Status   Active Line/Drains/Airways    Name:   Placement date:   Placement time:   Site:   Days:   Incision (Closed) 08/06/15 Groin Right   08/06/15    1303     1282          Intake/Output Last 24 hours No intake or output data in the 24 hours ending 02/08/19 0721  Labs/Imaging No results found for this or any  previous visit (from the past 48 hour(s)). No results found.  Pending Labs Unresulted Labs (From admission, onward)   None      Vitals/Pain Today's Vitals   02/07/19 1143 02/07/19 1920 02/07/19 2318 02/08/19 0643  BP: 127/64 131/71 (!) 127/50 117/65  Pulse: 62 65 64 (!) 57  Resp: 16 16 15 15   Temp: 98.5 F (36.9 C) 97.8 F (36.6 C) 97.8 F (36.6 C) 98 F (36.7 C)  TempSrc: Oral Axillary Axillary Oral  SpO2: 99% 97% 100% 99%  Weight:      Height:      PainSc:        Isolation Precautions No active isolations  Medications Medications  citalopram (CELEXA) tablet 20 mg (20 mg Oral Given 02/07/19 1614)  LORazepam (ATIVAN) tablet 0.5 mg (has no administration in time range)  risperiDONE (RISPERDAL) tablet 0.5 mg (0.5 mg Oral Given 02/07/19 2253)  acetaminophen (TYLENOL) tablet 650 mg (has no administration in time range)  ondansetron (ZOFRAN) tablet 4 mg (has no administration in time range)  alum & mag hydroxide-simeth (MAALOX/MYLANTA) 200-200-20 MG/5ML suspension 30 mL (has no administration in time range)  LORazepam (ATIVAN) injection 2 mg (2 mg Intramuscular Not Given 02/07/19 1857)  LORazepam (ATIVAN) injection 2 mg (2 mg Intramuscular Given 02/07/19 1444)  LORazepam (ATIVAN) injection 2 mg (2 mg Intramuscular Given 02/07/19 1856)    Mobility walks Low fall risk  Focused Assessments Neuro Assessment Handoff:  Swallow screen pass? No          Neuro Assessment:   Neuro Checks:      Last Documented NIHSS Modified Score:   Has TPA been given? No If patient is a Neuro Trauma and patient is going to OR before floor call report to [place] nurse: [phone] or [phone]     R Recommendations: See Admitting Provider Note  Report given to:   Additional Notes: None

## 2019-02-08 NOTE — Progress Notes (Signed)
02/08/2019  1441  Called again to give report. [phone]. No answer "extention 180 is not available".

## 2019-02-09 ENCOUNTER — Telehealth: Payer: Self-pay | Admitting: Family Medicine

## 2019-02-09 ENCOUNTER — Other Ambulatory Visit: Payer: Self-pay

## 2019-02-09 ENCOUNTER — Emergency Department (HOSPITAL_COMMUNITY)
Admission: EM | Admit: 2019-02-09 | Discharge: 2019-02-09 | Disposition: A | Discharge status: Home / Self Care | Payer: Medicare Other | Attending: Emergency Medicine | Admitting: Emergency Medicine

## 2019-02-09 ENCOUNTER — Encounter (HOSPITAL_COMMUNITY): Payer: Self-pay

## 2019-02-09 DIAGNOSIS — F17290 Nicotine dependence, other tobacco product, uncomplicated: Secondary | ICD-10-CM | POA: Insufficient documentation

## 2019-02-09 DIAGNOSIS — Z7982 Long term (current) use of aspirin: Secondary | ICD-10-CM | POA: Diagnosis not present

## 2019-02-09 DIAGNOSIS — W19XXXA Unspecified fall, initial encounter: Secondary | ICD-10-CM | POA: Diagnosis not present

## 2019-02-09 DIAGNOSIS — Y9210 Unspecified residential institution as the place of occurrence of the external cause: Secondary | ICD-10-CM | POA: Insufficient documentation

## 2019-02-09 DIAGNOSIS — S50311A Abrasion of right elbow, initial encounter: Secondary | ICD-10-CM | POA: Diagnosis not present

## 2019-02-09 DIAGNOSIS — Y999 Unspecified external cause status: Secondary | ICD-10-CM | POA: Diagnosis not present

## 2019-02-09 DIAGNOSIS — Y939 Activity, unspecified: Secondary | ICD-10-CM | POA: Diagnosis not present

## 2019-02-09 DIAGNOSIS — G309 Alzheimer's disease, unspecified: Secondary | ICD-10-CM | POA: Diagnosis not present

## 2019-02-09 DIAGNOSIS — Z7401 Bed confinement status: Secondary | ICD-10-CM | POA: Diagnosis not present

## 2019-02-09 DIAGNOSIS — I959 Hypotension, unspecified: Secondary | ICD-10-CM | POA: Diagnosis not present

## 2019-02-09 DIAGNOSIS — W0110XA Fall on same level from slipping, tripping and stumbling with subsequent striking against unspecified object, initial encounter: Secondary | ICD-10-CM | POA: Diagnosis not present

## 2019-02-09 DIAGNOSIS — F0280 Dementia in other diseases classified elsewhere without behavioral disturbance: Secondary | ICD-10-CM | POA: Diagnosis not present

## 2019-02-09 DIAGNOSIS — Z79899 Other long term (current) drug therapy: Secondary | ICD-10-CM | POA: Insufficient documentation

## 2019-02-09 DIAGNOSIS — R5381 Other malaise: Secondary | ICD-10-CM | POA: Diagnosis not present

## 2019-02-09 DIAGNOSIS — S59901A Unspecified injury of right elbow, initial encounter: Secondary | ICD-10-CM | POA: Diagnosis present

## 2019-02-09 DIAGNOSIS — M2550 Pain in unspecified joint: Secondary | ICD-10-CM | POA: Diagnosis not present

## 2019-02-09 DIAGNOSIS — T148XXA Other injury of unspecified body region, initial encounter: Secondary | ICD-10-CM

## 2019-02-09 DIAGNOSIS — R404 Transient alteration of awareness: Secondary | ICD-10-CM | POA: Diagnosis not present

## 2019-02-09 NOTE — ED Notes (Signed)
Patient repeatedly attempting to get out of bed. Patient cannot be verbally redirected. Patient confused- dementia hx. Sitter at bedside.

## 2019-02-09 NOTE — Telephone Encounter (Signed)
FYI

## 2019-02-09 NOTE — ED Triage Notes (Signed)
Patient BIB EMS from [facility]. Patient has history of dementia. Patient experienced a witnessed fall at approx 1520- Staff denied LOC, no blood thinners aside from daily aspirin. Patient hit the back of his head and has contusion noted to posterior scalp with no bleeding. Patient denies pain. EMS placed patient in C-collar due per protocol.  EMS VS: 106/70, 72SR, 20RR, 98% RA.  Wife was contacted by staff of [facility] and is aware patient is at [facility].

## 2019-02-09 NOTE — ED Notes (Addendum)
PTAR called for patient transport back to [facility]. Patient resting quietly at this time. VSS.   Report called to [facility] staff, [place] on 4th floor.

## 2019-02-09 NOTE — Telephone Encounter (Signed)
Patient's wife calling as FYI to inform Dr. Elease Hashimoto that patient fell today and hit his head at [facility] and is currently in route to [facility].

## 2019-02-09 NOTE — ED Notes (Signed)
PTAR present to transport patient back to [facility]. Patient VSS. Patient resting quietly.

## 2019-02-12 NOTE — ED Provider Notes (Signed)
[place] DEPT Provider Note   CSN: [id number] Arrival date & time: 02/09/19  1600     History   Chief Complaint Chief Complaint  Patient presents with   Fall    HPI Dennis Harper is a 78 y.o. male.  HPI:  78 year old male sent from nursing facility for evaluation after fall.  Patient has baseline dementia.  Reportedly at his baseline.  He has no acute complaints for me.  Currently struck his head.  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   Qualifier: Diagnosis of  By: Elease Hashimoto MD, Bruce      Patient Active Problem List   Diagnosis Date Noted   Acute bronchitis 04/30/2015   Memory loss 04/16/2015   Alzheimer's dementia ([place]) 09/16/2014   Right inguinal hernia 08/23/2014   GERD (gastroesophageal reflux disease) 01/12/2012   NOISE-INDUCED HEARING LOSS 02/03/2010   GALLSTONES 01/06/2010   BENIGN POSITIONAL VERTIGO 12/20/2008   PARESTHESIA 12/20/2008    Past Surgical History:  Procedure Laterality Date   APPENDECTOMY     age 18   INGUINAL HERNIA REPAIR Right 08/06/2015   Procedure: RIGHT INGUINAL HERNIA REPAIR WITH MESH;  Surgeon: Coralie Keens, MD;  Location: [place];  Service: General;  Laterality: Right;   INSERTION OF MESH Right 08/06/2015   Procedure: INSERTION OF MESH;  Surgeon: Coralie Keens, MD;  Location: [place];  Service: General;  Laterality: Right;        Home Medications    Prior to Admission medications   Medication Sig Start Date End Date Taking? Authorizing Provider  ASPIRIN 81 PO Take 162 mg by mouth daily as needed (pain).    [provider]  aspirin EC 81 MG tablet Take 81 mg by mouth daily as needed for mild pain.    [provider]    citalopram (CELEXA) 20 MG tablet Take 20 mg by mouth daily.    [provider]  LORazepam (ATIVAN) 0.5 MG tablet Take 1 tablet (0.5 mg total) by mouth every 8 (eight) hours as needed for anxiety. 02/07/19   Burchette, Alinda Sierras, MD  LORazepam (ATIVAN) 0.5 MG tablet Take 0.5 mg by mouth every 8 (eight) hours as needed for agitation. 12/02/18   [provider]  risperiDONE (RISPERDAL) 0.5 MG tablet Take 1 tablet (0.5 mg total) by mouth 2 (two) times daily. 10/20/18   Burchette, Alinda Sierras, MD  donepezil (ARICEPT) 10 MG tablet Take 1 tablet (10 mg total) by mouth at bedtime. Patient not taking: Reported on 09/24/2018 11/04/17 02/08/19  Eulas Post, MD    Family History Family History  Problem Relation Age of Onset   Alcohol abuse Neg Hx        family hx   Heart disease Neg Hx        family hx    Social History Social History   Tobacco Use   Smoking status: Current Some Day Smoker    Types: Cigars   Smokeless tobacco: Never Used   Tobacco comment: smokes occ cigar 2-3 time/week  Substance Use Topics   Alcohol use: Yes    Alcohol/week: 0.0 standard drinks    Comment: social   Drug use: No     Allergies   Patient has no  known allergies.   Review of Systems Review of Systems  Level 5 caveat because of dementia. Physical Exam Updated Vital Signs BP 127/77 (BP Location: Left Arm)    Pulse 70    Temp 98.2 F (36.8 C) (Oral)    Resp 20    SpO2 99%   Physical Exam Vitals signs and nursing note reviewed.  Constitutional:      General: He is not in acute distress.    Appearance: He is well-developed.  HENT:     Head: Normocephalic and atraumatic.     Comments: Perhaps some mild erythema to posterior scalp.  Very impressive.  Does not seem tender.  He is able to sit up in bed unassisted.  No midline spinal tenderness appreciated.  Moving all extremities without apparent difficulty.  Small superficial abrasion/skin tear to right elbow.  Nonbleeding.  Able to  range his elbow without significant discomfort.  No bony tenderness. Eyes:     General:        Right eye: No discharge.        Left eye: No discharge.     Conjunctiva/sclera: Conjunctivae normal.  Neck:     Musculoskeletal: Neck supple.  Cardiovascular:     Rate and Rhythm: Normal rate and regular rhythm.     Heart sounds: Normal heart sounds. No murmur. No friction rub. No gallop.   Pulmonary:     Effort: Pulmonary effort is normal. No respiratory distress.     Breath sounds: Normal breath sounds.  Abdominal:     General: There is no distension.     Palpations: Abdomen is soft.     Tenderness: There is no abdominal tenderness.  Musculoskeletal:        General: No tenderness.  Skin:    General: Skin is warm and dry.  Neurological:     Mental Status: He is alert.  Psychiatric:        Behavior: Behavior normal.        Thought Content: Thought content normal.      ED Treatments / Results  Labs (all labs ordered are listed, but only abnormal results are displayed) Labs Reviewed - No data to display  EKG None  Radiology No results found.   Procedures Procedures (including critical care time)  Medications Ordered in ED Medications - No data to display   Initial Impression / Assessment and Plan / ED Course  I have reviewed the triage vital signs and the nursing notes.  Pertinent labs & imaging results that were available during my care of the patient were reviewed by me and considered in my medical decision making (see chart for details).    78 year old male sent from nursing facility for evaluation after fall.  He is reported at his baseline mental status.  He does not appear to have any acute injuries that require work-up at this time.  He will be discharged back to his facility.  Final Clinical Impressions(s) / ED Diagnoses   Final diagnoses:  Fall, initial encounter  Abrasion    ED Discharge Orders    None       Virgel Manifold, MD 02/12/19 1220

## 2019-02-13 ENCOUNTER — Telehealth: Payer: Self-pay

## 2019-02-13 DIAGNOSIS — R278 Other lack of coordination: Secondary | ICD-10-CM | POA: Diagnosis not present

## 2019-02-13 DIAGNOSIS — G309 Alzheimer's disease, unspecified: Secondary | ICD-10-CM | POA: Diagnosis not present

## 2019-02-13 NOTE — Telephone Encounter (Signed)
FYI  Copied from [place] [id number]. Topic: General - Call Back - No Documentation >> Feb 13, 2019  3:03 PM Erick Blinks wrote: Lanny Hurst is now at [facility] care unit, as of yesterday he is doing well, if they have any questions please contact wife Best contact: [phone]

## 2019-02-14 ENCOUNTER — Emergency Department (HOSPITAL_COMMUNITY)
Admission: EM | Admit: 2019-02-14 | Discharge: 2019-02-14 | Disposition: A | Discharge status: Home / Self Care | Payer: Medicare Other | Attending: Emergency Medicine | Admitting: Emergency Medicine

## 2019-02-14 ENCOUNTER — Emergency Department (HOSPITAL_COMMUNITY): Payer: Medicare Other

## 2019-02-14 DIAGNOSIS — G9608 Other cranial cerebrospinal fluid leak: Secondary | ICD-10-CM

## 2019-02-14 DIAGNOSIS — R4182 Altered mental status, unspecified: Secondary | ICD-10-CM | POA: Diagnosis not present

## 2019-02-14 DIAGNOSIS — Z7982 Long term (current) use of aspirin: Secondary | ICD-10-CM | POA: Insufficient documentation

## 2019-02-14 DIAGNOSIS — Y92129 Unspecified place in nursing home as the place of occurrence of the external cause: Secondary | ICD-10-CM | POA: Insufficient documentation

## 2019-02-14 DIAGNOSIS — R410 Disorientation, unspecified: Secondary | ICD-10-CM | POA: Diagnosis not present

## 2019-02-14 DIAGNOSIS — S0003XA Contusion of scalp, initial encounter: Secondary | ICD-10-CM | POA: Diagnosis not present

## 2019-02-14 DIAGNOSIS — G960 Cerebrospinal fluid leak: Secondary | ICD-10-CM | POA: Diagnosis not present

## 2019-02-14 DIAGNOSIS — Z79899 Other long term (current) drug therapy: Secondary | ICD-10-CM | POA: Insufficient documentation

## 2019-02-14 DIAGNOSIS — Y939 Activity, unspecified: Secondary | ICD-10-CM | POA: Diagnosis not present

## 2019-02-14 DIAGNOSIS — G309 Alzheimer's disease, unspecified: Secondary | ICD-10-CM | POA: Diagnosis not present

## 2019-02-14 DIAGNOSIS — W19XXXA Unspecified fall, initial encounter: Secondary | ICD-10-CM | POA: Diagnosis not present

## 2019-02-14 DIAGNOSIS — S0990XA Unspecified injury of head, initial encounter: Secondary | ICD-10-CM | POA: Diagnosis not present

## 2019-02-14 DIAGNOSIS — S0083XA Contusion of other part of head, initial encounter: Secondary | ICD-10-CM | POA: Diagnosis not present

## 2019-02-14 DIAGNOSIS — S199XXA Unspecified injury of neck, initial encounter: Secondary | ICD-10-CM | POA: Diagnosis not present

## 2019-02-14 DIAGNOSIS — F17290 Nicotine dependence, other tobacco product, uncomplicated: Secondary | ICD-10-CM | POA: Diagnosis not present

## 2019-02-14 DIAGNOSIS — M2550 Pain in unspecified joint: Secondary | ICD-10-CM | POA: Diagnosis not present

## 2019-02-14 DIAGNOSIS — Z7401 Bed confinement status: Secondary | ICD-10-CM | POA: Diagnosis not present

## 2019-02-14 DIAGNOSIS — F0280 Dementia in other diseases classified elsewhere without behavioral disturbance: Secondary | ICD-10-CM | POA: Insufficient documentation

## 2019-02-14 DIAGNOSIS — Y999 Unspecified external cause status: Secondary | ICD-10-CM | POA: Diagnosis not present

## 2019-02-14 DIAGNOSIS — S065X0A Traumatic subdural hemorrhage without loss of consciousness, initial encounter: Secondary | ICD-10-CM | POA: Diagnosis not present

## 2019-02-14 DIAGNOSIS — S06360A Traumatic hemorrhage of cerebrum, unspecified, without loss of consciousness, initial encounter: Secondary | ICD-10-CM | POA: Diagnosis not present

## 2019-02-14 DIAGNOSIS — S3993XA Unspecified injury of pelvis, initial encounter: Secondary | ICD-10-CM | POA: Diagnosis not present

## 2019-02-14 DIAGNOSIS — R52 Pain, unspecified: Secondary | ICD-10-CM

## 2019-02-14 DIAGNOSIS — S299XXA Unspecified injury of thorax, initial encounter: Secondary | ICD-10-CM | POA: Diagnosis not present

## 2019-02-14 DIAGNOSIS — R0682 Tachypnea, not elsewhere classified: Secondary | ICD-10-CM | POA: Diagnosis not present

## 2019-02-14 DIAGNOSIS — S0512XA Contusion of eyeball and orbital tissues, left eye, initial encounter: Secondary | ICD-10-CM | POA: Diagnosis not present

## 2019-02-14 DIAGNOSIS — R404 Transient alteration of awareness: Secondary | ICD-10-CM | POA: Diagnosis not present

## 2019-02-14 LAB — CBC WITH DIFFERENTIAL/PLATELET
Abs Immature Granulocytes: 0.03 10*3/uL (ref 0.00–0.07)
Basophils Absolute: 0 10*3/uL (ref 0.0–0.1)
Basophils Relative: 0 %
Eosinophils Absolute: 0 10*3/uL (ref 0.0–0.5)
Eosinophils Relative: 0 %
HCT: 31.2 % — ABNORMAL LOW (ref 39.0–52.0)
Hemoglobin: 9.8 g/dL — ABNORMAL LOW (ref 13.0–17.0)
Immature Granulocytes: 0 %
Lymphocytes Relative: 11 %
Lymphs Abs: 0.8 10*3/uL (ref 0.7–4.0)
MCH: 30.2 pg (ref 26.0–34.0)
MCHC: 31.4 g/dL (ref 30.0–36.0)
MCV: 96.3 fL (ref 80.0–100.0)
Monocytes Absolute: 0.6 10*3/uL (ref 0.1–1.0)
Monocytes Relative: 7 %
Neutro Abs: 6.4 10*3/uL (ref 1.7–7.7)
Neutrophils Relative %: 82 %
Platelets: 153 10*3/uL (ref 150–400)
RBC: 3.24 MIL/uL — ABNORMAL LOW (ref 4.22–5.81)
RDW: 13.7 % (ref 11.5–15.5)
WBC: 7.8 10*3/uL (ref 4.0–10.5)
nRBC: 0 % (ref 0.0–0.2)

## 2019-02-14 LAB — BASIC METABOLIC PANEL
Anion gap: 8 (ref 5–15)
BUN: 25 mg/dL — ABNORMAL HIGH (ref 8–23)
CO2: 27 mmol/L (ref 22–32)
Calcium: 8.8 mg/dL — ABNORMAL LOW (ref 8.9–10.3)
Chloride: 107 mmol/L (ref 98–111)
Creatinine, Ser: 0.78 mg/dL (ref 0.61–1.24)
GFR calc Af Amer: >60 mL/min (ref >60)
GFR calc non Af Amer: >60 mL/min (ref >60)
Glucose, Bld: 107 mg/dL — ABNORMAL HIGH (ref 70–99)
Potassium: 4.2 mmol/L (ref 3.5–5.1)
Sodium: 142 mmol/L (ref 135–145)

## 2019-02-14 NOTE — ED Triage Notes (Signed)
Pt to ED via EMS from [facility], pt had unwitnessed fall around 5-6 am this morning. Hematoma noted to pt left forehead. Pt orientation at baseline. Oriented x 1- to self. Pt not on blood thinners. No other injuries noted per EMS.

## 2019-02-14 NOTE — ED Notes (Signed)
PTAR on unit to transfer pt back to [facility].

## 2019-02-14 NOTE — Discharge Instructions (Addendum)
Return to ER if you experience change in mental status or any new or concerning symptoms  Your head CT done today is reassuring

## 2019-02-14 NOTE — ED Provider Notes (Signed)
[place] DEPT Provider Note   CSN: [id number] Arrival date & time: 02/14/19  0748     History   Chief Complaint Chief Complaint  Patient presents with   Fall   Head Injury    hematoma     HPI Dennis Harper is a 78 y.o. male.  Per nursing, patient was brought to the ED for unwitnessed fall from nursing facility today patient is on a memory ward and is reported to be "at his baseline." Patient states "was I supposed to have fallen" when asked if he fell today.  Patient denies any pain to his head and states he does not member falling today.  Patient denies any recollection of events leading up to fall and is unable to recollect events after the fall before presenting to the ED. Patient states he occasionally takes ASA but does not recollect taking it today.      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   Qualifier: Diagnosis of  By: Elease Hashimoto MD, Bruce      Patient Active Problem List   Diagnosis Date Noted   Acute bronchitis 04/30/2015   Memory loss 04/16/2015   Alzheimer's dementia ([place]) 09/16/2014   Right inguinal hernia 08/23/2014   GERD (gastroesophageal reflux disease) 01/12/2012   NOISE-INDUCED HEARING LOSS 02/03/2010   GALLSTONES 01/06/2010   BENIGN POSITIONAL VERTIGO 12/20/2008   PARESTHESIA 12/20/2008    Past Surgical History:  Procedure Laterality Date   APPENDECTOMY     age 78   INGUINAL HERNIA REPAIR Right 08/06/2015   Procedure: RIGHT INGUINAL HERNIA REPAIR WITH MESH;  Surgeon: Coralie Keens, MD;  Location: [facility];  Service: General;  Laterality: Right;   INSERTION OF MESH Right 08/06/2015   Procedure: INSERTION OF MESH;  Surgeon: Coralie Keens, MD;  Location: [place];  Service: General;  Laterality: Right;        Home Medications    Prior to Admission medications   Medication Sig Start Date End Date Taking? Authorizing Provider  ASPIRIN 81 PO Take 162 mg by mouth daily as needed (pain).    [provider]  aspirin EC 81 MG tablet Take 81 mg by mouth daily as needed for mild pain.    [provider]  citalopram (CELEXA) 20 MG tablet Take 20 mg by mouth daily.    [provider]  LORazepam (ATIVAN) 0.5 MG tablet Take 1 tablet (0.5 mg total) by mouth every 8 (eight) hours as needed for anxiety. 02/07/19   Burchette, Alinda Sierras, MD  LORazepam (ATIVAN) 0.5 MG tablet Take 0.5 mg by mouth every 8 (eight) hours as needed for agitation. 12/02/18   [provider]  risperiDONE (RISPERDAL) 0.5 MG tablet Take 1 tablet (0.5 mg total) by mouth 2 (two) times daily. 10/20/18   Burchette, Alinda Sierras, MD  donepezil (ARICEPT) 10 MG tablet Take 1 tablet (10 mg total) by mouth at bedtime. Patient not taking: Reported on 09/24/2018 11/04/17 02/08/19  Eulas Post, MD    Family History Family History  Problem Relation Age of Onset   Alcohol abuse Neg Hx        family hx   Heart disease Neg Hx        family  hx    Social History Social History   Tobacco Use   Smoking status: Current Some Day Smoker    Types: Cigars   Smokeless tobacco: Never Used   Tobacco comment: smokes occ cigar 2-3 time/week  Substance Use Topics   Alcohol use: Yes    Alcohol/week: 0.0 standard drinks    Comment: social   Drug use: No     Allergies   Patient has no known allergies.   Review of Systems Review of Systems  Constitutional: Negative for chills and fever.  HENT: Negative for congestion.   Eyes: Negative for visual disturbance.  Respiratory: Negative for shortness of breath.   Cardiovascular: Negative for chest pain.  Gastrointestinal: Negative for abdominal pain, diarrhea, nausea and vomiting.  Genitourinary:  Negative for dysuria and hematuria.  Musculoskeletal: Negative for arthralgias.  Skin: Negative for rash.  Neurological: Negative for headaches.     Physical Exam Updated Vital Signs BP 123/66 (BP Location: Left Arm)    Pulse 62    Temp 97.8 F (36.6 C) (Oral)    Resp 18    Ht 5\' 9"  (1.753 m)    Wt 77 kg    SpO2 100%    BMI 25.07 kg/m   Physical Exam Vitals signs and nursing note reviewed.  Constitutional:      General: He is not in acute distress. HENT:     Head: Normocephalic and atraumatic.      Nose: Nose normal.     Mouth/Throat:     Mouth: Mucous membranes are moist.  Eyes:     Extraocular Movements: Extraocular movements intact.  Neck:     Musculoskeletal: Normal range of motion. No muscular tenderness.  Cardiovascular:     Rate and Rhythm: Normal rate and regular rhythm.     Heart sounds: No murmur. No friction rub. No gallop.   Pulmonary:     Effort: Pulmonary effort is normal. No respiratory distress.     Breath sounds: No stridor. No wheezing or rhonchi.  Abdominal:     General: Bowel sounds are normal.     Palpations: Abdomen is soft.     Tenderness: There is no abdominal tenderness. There is no guarding.  Musculoskeletal:        General: No tenderness or deformity.     Right lower leg: No edema.     Left lower leg: No edema.  Skin:    General: Skin is warm and dry.     Comments: Bruising over the right rib cage laterally.  Neurological:     Mental Status: He is alert.     Comments: Patient is AO x1 Able to follow commands but slow to understand them. Patient moves all 4 extremities Grip strength 5/5 bilaterally. Elbow flexion 5/5, elbow extension 5/5 Smile is symmetric  Psychiatric:        Mood and Affect: Mood normal.        Behavior: Behavior normal.      ED Treatments / Results  Labs (all labs ordered are listed, but only abnormal results are displayed) Labs Reviewed  CBC WITH DIFFERENTIAL/PLATELET - Abnormal; Notable for the following  components:      Result Value   RBC 3.24 (*)    Hemoglobin 9.8 (*)    HCT 31.2 (*)    All other components within normal limits  BASIC METABOLIC PANEL - Abnormal; Notable for the following components:   Glucose, Bld 107 (*)    BUN 25 (*)    Calcium  8.8 (*)    All other components within normal limits    EKG None  Radiology No results found.  Procedures Procedures (including critical care time)  Medications Ordered in ED Medications - No data to display   Initial Impression / Assessment and Plan / ED Course  I have reviewed the triage vital signs and the nursing notes.  Pertinent labs & imaging results that were available during my care of the patient were reviewed by me and considered in my medical decision making (see chart for details).  Clinical Course as of Feb 13 999  Wed Feb 14, 2019  0955 CT Head Wo Contrast [WF]    Clinical Course User Index [WF] Tedd Sias, [place]   Patient summary: Patient is a 78 year old male with baseline memory and cognitive deficits presenting after a fall today with abrasion and contusion over his left eyebrow.  Patient presentation concerning for intracranial hemorrhage.  Patient is on aspirin unknown last dose.  No neuro deficits on exam.   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  X-ray of chest and pelvis negative for fracture.    Patient discussed with neurosurgery Dr. Ronnald Ramp states that head imaging is not concerning for acute process and recommends discharge to care facility.  Plain films of chest and pelvis show no acute fracture.  Patient appears to be at baseline and will be recommended to return if changes in mentation occur.      Final Clinical Impressions(s) / ED Diagnoses   Final diagnoses:  None    ED Discharge Orders    None       Tedd Sias, [place] 02/14/19 1156    Lucrezia Starch, MD 02/15/19 [id number]

## 2019-02-16 DIAGNOSIS — G309 Alzheimer's disease, unspecified: Secondary | ICD-10-CM | POA: Diagnosis not present

## 2019-02-16 DIAGNOSIS — R278 Other lack of coordination: Secondary | ICD-10-CM | POA: Diagnosis not present

## 2019-02-20 ENCOUNTER — Telehealth: Payer: Self-pay | Admitting: Family Medicine

## 2019-02-20 DIAGNOSIS — R278 Other lack of coordination: Secondary | ICD-10-CM | POA: Diagnosis not present

## 2019-02-20 DIAGNOSIS — G309 Alzheimer's disease, unspecified: Secondary | ICD-10-CM | POA: Diagnosis not present

## 2019-02-20 NOTE — Telephone Encounter (Signed)
Copied from [place] [id number]. Topic: General - Other >> Feb 20, 2019  9:29 AM Sheran Luz wrote: Patient's wife requesting a call back from [facility] to discuss patient's medication list.

## 2019-02-21 DIAGNOSIS — G309 Alzheimer's disease, unspecified: Secondary | ICD-10-CM | POA: Diagnosis not present

## 2019-02-21 DIAGNOSIS — F419 Anxiety disorder, unspecified: Secondary | ICD-10-CM | POA: Diagnosis not present

## 2019-02-21 DIAGNOSIS — F329 Major depressive disorder, single episode, unspecified: Secondary | ICD-10-CM | POA: Diagnosis not present

## 2019-02-21 DIAGNOSIS — F0281 Dementia in other diseases classified elsewhere with behavioral disturbance: Secondary | ICD-10-CM | POA: Diagnosis not present

## 2019-02-22 DIAGNOSIS — R5381 Other malaise: Secondary | ICD-10-CM | POA: Diagnosis not present

## 2019-02-22 DIAGNOSIS — G309 Alzheimer's disease, unspecified: Secondary | ICD-10-CM | POA: Diagnosis not present

## 2019-02-22 DIAGNOSIS — R278 Other lack of coordination: Secondary | ICD-10-CM | POA: Diagnosis not present

## 2019-02-22 DIAGNOSIS — G301 Alzheimer's disease with late onset: Secondary | ICD-10-CM | POA: Diagnosis not present

## 2019-02-22 DIAGNOSIS — F0280 Dementia in other diseases classified elsewhere without behavioral disturbance: Secondary | ICD-10-CM | POA: Diagnosis not present

## 2019-02-22 DIAGNOSIS — F325 Major depressive disorder, single episode, in full remission: Secondary | ICD-10-CM | POA: Diagnosis not present

## 2019-02-22 NOTE — Telephone Encounter (Signed)
Spoke with wife and she would like to know if patient should continue or if there is an alternative for  memantine (NAMENDA) 10 MG tablet  The cost is $68 for 60 pills?

## 2019-02-23 NOTE — Telephone Encounter (Signed)
His dementia is so advanced that I doubt he is getting benefit from [facility] at this time- so, if cost is an issue would be OK to d/c the [facility].

## 2019-02-26 DIAGNOSIS — G301 Alzheimer's disease with late onset: Secondary | ICD-10-CM | POA: Diagnosis not present

## 2019-02-26 DIAGNOSIS — G309 Alzheimer's disease, unspecified: Secondary | ICD-10-CM | POA: Diagnosis not present

## 2019-02-26 DIAGNOSIS — Z9181 History of falling: Secondary | ICD-10-CM | POA: Diagnosis not present

## 2019-02-26 DIAGNOSIS — R54 Age-related physical debility: Secondary | ICD-10-CM | POA: Diagnosis not present

## 2019-02-26 DIAGNOSIS — F0280 Dementia in other diseases classified elsewhere without behavioral disturbance: Secondary | ICD-10-CM | POA: Diagnosis not present

## 2019-02-27 DIAGNOSIS — D519 Vitamin B12 deficiency anemia, unspecified: Secondary | ICD-10-CM | POA: Diagnosis not present

## 2019-02-27 DIAGNOSIS — D649 Anemia, unspecified: Secondary | ICD-10-CM | POA: Diagnosis not present

## 2019-02-27 DIAGNOSIS — G309 Alzheimer's disease, unspecified: Secondary | ICD-10-CM | POA: Diagnosis not present

## 2019-02-27 NOTE — Telephone Encounter (Signed)
Wife is aware

## 2019-03-01 DIAGNOSIS — F0280 Dementia in other diseases classified elsewhere without behavioral disturbance: Secondary | ICD-10-CM | POA: Diagnosis not present

## 2019-03-01 DIAGNOSIS — G309 Alzheimer's disease, unspecified: Secondary | ICD-10-CM | POA: Diagnosis not present

## 2019-03-01 DIAGNOSIS — R54 Age-related physical debility: Secondary | ICD-10-CM | POA: Diagnosis not present

## 2019-03-01 DIAGNOSIS — F0281 Dementia in other diseases classified elsewhere with behavioral disturbance: Secondary | ICD-10-CM | POA: Diagnosis not present

## 2019-03-01 DIAGNOSIS — Z9181 History of falling: Secondary | ICD-10-CM | POA: Diagnosis not present

## 2019-03-01 DIAGNOSIS — R5381 Other malaise: Secondary | ICD-10-CM | POA: Diagnosis not present

## 2019-03-01 DIAGNOSIS — G301 Alzheimer's disease with late onset: Secondary | ICD-10-CM | POA: Diagnosis not present

## 2019-03-02 DIAGNOSIS — R54 Age-related physical debility: Secondary | ICD-10-CM | POA: Diagnosis not present

## 2019-03-02 DIAGNOSIS — G301 Alzheimer's disease with late onset: Secondary | ICD-10-CM | POA: Diagnosis not present

## 2019-03-02 DIAGNOSIS — Z9181 History of falling: Secondary | ICD-10-CM | POA: Diagnosis not present

## 2019-03-02 DIAGNOSIS — F0280 Dementia in other diseases classified elsewhere without behavioral disturbance: Secondary | ICD-10-CM | POA: Diagnosis not present

## 2019-03-02 DIAGNOSIS — G309 Alzheimer's disease, unspecified: Secondary | ICD-10-CM | POA: Diagnosis not present

## 2019-03-05 ENCOUNTER — Other Ambulatory Visit: Payer: Self-pay | Admitting: Family Medicine

## 2019-03-05 DIAGNOSIS — G301 Alzheimer's disease with late onset: Secondary | ICD-10-CM | POA: Diagnosis not present

## 2019-03-05 DIAGNOSIS — G309 Alzheimer's disease, unspecified: Secondary | ICD-10-CM | POA: Diagnosis not present

## 2019-03-05 DIAGNOSIS — F0280 Dementia in other diseases classified elsewhere without behavioral disturbance: Secondary | ICD-10-CM | POA: Diagnosis not present

## 2019-03-05 DIAGNOSIS — R54 Age-related physical debility: Secondary | ICD-10-CM | POA: Diagnosis not present

## 2019-03-05 DIAGNOSIS — Z9181 History of falling: Secondary | ICD-10-CM | POA: Diagnosis not present

## 2019-03-06 DIAGNOSIS — Z9181 History of falling: Secondary | ICD-10-CM | POA: Diagnosis not present

## 2019-03-06 DIAGNOSIS — R54 Age-related physical debility: Secondary | ICD-10-CM | POA: Diagnosis not present

## 2019-03-06 DIAGNOSIS — G301 Alzheimer's disease with late onset: Secondary | ICD-10-CM | POA: Diagnosis not present

## 2019-03-06 DIAGNOSIS — G309 Alzheimer's disease, unspecified: Secondary | ICD-10-CM | POA: Diagnosis not present

## 2019-03-06 DIAGNOSIS — F0280 Dementia in other diseases classified elsewhere without behavioral disturbance: Secondary | ICD-10-CM | POA: Diagnosis not present

## 2019-03-06 DIAGNOSIS — R569 Unspecified convulsions: Secondary | ICD-10-CM | POA: Diagnosis not present

## 2019-03-06 NOTE — Telephone Encounter (Signed)
Are you still filling this for patient?

## 2019-03-06 NOTE — Telephone Encounter (Signed)
He is admitted to nursing home facility so we should not be prescribing at this point.

## 2019-03-07 DIAGNOSIS — Z23 Encounter for immunization: Secondary | ICD-10-CM | POA: Diagnosis not present

## 2019-03-08 DIAGNOSIS — G309 Alzheimer's disease, unspecified: Secondary | ICD-10-CM | POA: Diagnosis not present

## 2019-03-08 DIAGNOSIS — Z9181 History of falling: Secondary | ICD-10-CM | POA: Diagnosis not present

## 2019-03-08 DIAGNOSIS — T7840XA Allergy, unspecified, initial encounter: Secondary | ICD-10-CM | POA: Diagnosis not present

## 2019-03-08 DIAGNOSIS — R5381 Other malaise: Secondary | ICD-10-CM | POA: Diagnosis not present

## 2019-03-08 DIAGNOSIS — F0280 Dementia in other diseases classified elsewhere without behavioral disturbance: Secondary | ICD-10-CM | POA: Diagnosis not present

## 2019-03-08 DIAGNOSIS — R54 Age-related physical debility: Secondary | ICD-10-CM | POA: Diagnosis not present

## 2019-03-08 DIAGNOSIS — G301 Alzheimer's disease with late onset: Secondary | ICD-10-CM | POA: Diagnosis not present

## 2019-03-09 DIAGNOSIS — Z9181 History of falling: Secondary | ICD-10-CM | POA: Diagnosis not present

## 2019-03-09 DIAGNOSIS — F0280 Dementia in other diseases classified elsewhere without behavioral disturbance: Secondary | ICD-10-CM | POA: Diagnosis not present

## 2019-03-09 DIAGNOSIS — G309 Alzheimer's disease, unspecified: Secondary | ICD-10-CM | POA: Diagnosis not present

## 2019-03-09 DIAGNOSIS — G301 Alzheimer's disease with late onset: Secondary | ICD-10-CM | POA: Diagnosis not present

## 2019-03-09 DIAGNOSIS — R54 Age-related physical debility: Secondary | ICD-10-CM | POA: Diagnosis not present

## 2019-03-12 ENCOUNTER — Other Ambulatory Visit: Payer: Self-pay | Admitting: Family Medicine

## 2019-03-12 DIAGNOSIS — F0280 Dementia in other diseases classified elsewhere without behavioral disturbance: Secondary | ICD-10-CM | POA: Diagnosis not present

## 2019-03-12 DIAGNOSIS — G301 Alzheimer's disease with late onset: Secondary | ICD-10-CM | POA: Diagnosis not present

## 2019-03-12 DIAGNOSIS — R54 Age-related physical debility: Secondary | ICD-10-CM | POA: Diagnosis not present

## 2019-03-12 DIAGNOSIS — Z9181 History of falling: Secondary | ICD-10-CM | POA: Diagnosis not present

## 2019-03-12 DIAGNOSIS — G309 Alzheimer's disease, unspecified: Secondary | ICD-10-CM | POA: Diagnosis not present

## 2019-03-14 DIAGNOSIS — Z9181 History of falling: Secondary | ICD-10-CM | POA: Diagnosis not present

## 2019-03-14 DIAGNOSIS — R54 Age-related physical debility: Secondary | ICD-10-CM | POA: Diagnosis not present

## 2019-03-14 DIAGNOSIS — F0280 Dementia in other diseases classified elsewhere without behavioral disturbance: Secondary | ICD-10-CM | POA: Diagnosis not present

## 2019-03-14 DIAGNOSIS — G301 Alzheimer's disease with late onset: Secondary | ICD-10-CM | POA: Diagnosis not present

## 2019-03-14 DIAGNOSIS — G309 Alzheimer's disease, unspecified: Secondary | ICD-10-CM | POA: Diagnosis not present

## 2019-03-15 ENCOUNTER — Other Ambulatory Visit: Payer: Self-pay | Admitting: Family Medicine

## 2019-03-16 DIAGNOSIS — G309 Alzheimer's disease, unspecified: Secondary | ICD-10-CM | POA: Diagnosis not present

## 2019-03-16 DIAGNOSIS — F329 Major depressive disorder, single episode, unspecified: Secondary | ICD-10-CM | POA: Diagnosis not present

## 2019-03-16 DIAGNOSIS — F0281 Dementia in other diseases classified elsewhere with behavioral disturbance: Secondary | ICD-10-CM | POA: Diagnosis not present

## 2019-03-16 DIAGNOSIS — F419 Anxiety disorder, unspecified: Secondary | ICD-10-CM | POA: Diagnosis not present

## 2019-03-17 ENCOUNTER — Other Ambulatory Visit: Payer: Self-pay | Admitting: Family Medicine

## 2019-03-19 DIAGNOSIS — F0280 Dementia in other diseases classified elsewhere without behavioral disturbance: Secondary | ICD-10-CM | POA: Diagnosis not present

## 2019-03-19 DIAGNOSIS — Z9181 History of falling: Secondary | ICD-10-CM | POA: Diagnosis not present

## 2019-03-19 DIAGNOSIS — G301 Alzheimer's disease with late onset: Secondary | ICD-10-CM | POA: Diagnosis not present

## 2019-03-19 DIAGNOSIS — R54 Age-related physical debility: Secondary | ICD-10-CM | POA: Diagnosis not present

## 2019-03-19 DIAGNOSIS — G309 Alzheimer's disease, unspecified: Secondary | ICD-10-CM | POA: Diagnosis not present

## 2019-03-20 DIAGNOSIS — E785 Hyperlipidemia, unspecified: Secondary | ICD-10-CM | POA: Diagnosis not present

## 2019-03-20 DIAGNOSIS — G309 Alzheimer's disease, unspecified: Secondary | ICD-10-CM | POA: Diagnosis not present

## 2019-03-20 DIAGNOSIS — E876 Hypokalemia: Secondary | ICD-10-CM | POA: Diagnosis not present

## 2019-03-20 DIAGNOSIS — R569 Unspecified convulsions: Secondary | ICD-10-CM | POA: Diagnosis not present

## 2019-03-20 NOTE — Telephone Encounter (Signed)
Rx dose not listed in the pts chart?

## 2019-03-20 NOTE — Telephone Encounter (Signed)
He is now in a nursing home- so should be getting refilled there.

## 2019-03-20 NOTE — Telephone Encounter (Signed)
Denial sent to the pharmacy with note attached as below.

## 2019-03-21 DIAGNOSIS — G301 Alzheimer's disease with late onset: Secondary | ICD-10-CM | POA: Diagnosis not present

## 2019-03-21 DIAGNOSIS — F0280 Dementia in other diseases classified elsewhere without behavioral disturbance: Secondary | ICD-10-CM | POA: Diagnosis not present

## 2019-03-21 DIAGNOSIS — Z9181 History of falling: Secondary | ICD-10-CM | POA: Diagnosis not present

## 2019-03-21 DIAGNOSIS — R54 Age-related physical debility: Secondary | ICD-10-CM | POA: Diagnosis not present

## 2019-03-21 DIAGNOSIS — G309 Alzheimer's disease, unspecified: Secondary | ICD-10-CM | POA: Diagnosis not present

## 2019-03-23 DIAGNOSIS — G301 Alzheimer's disease with late onset: Secondary | ICD-10-CM | POA: Diagnosis not present

## 2019-03-23 DIAGNOSIS — G309 Alzheimer's disease, unspecified: Secondary | ICD-10-CM | POA: Diagnosis not present

## 2019-03-23 DIAGNOSIS — F0280 Dementia in other diseases classified elsewhere without behavioral disturbance: Secondary | ICD-10-CM | POA: Diagnosis not present

## 2019-03-23 DIAGNOSIS — R54 Age-related physical debility: Secondary | ICD-10-CM | POA: Diagnosis not present

## 2019-03-23 DIAGNOSIS — Z9181 History of falling: Secondary | ICD-10-CM | POA: Diagnosis not present

## 2019-03-26 DIAGNOSIS — F0280 Dementia in other diseases classified elsewhere without behavioral disturbance: Secondary | ICD-10-CM | POA: Diagnosis not present

## 2019-03-26 DIAGNOSIS — G301 Alzheimer's disease with late onset: Secondary | ICD-10-CM | POA: Diagnosis not present

## 2019-03-26 DIAGNOSIS — R54 Age-related physical debility: Secondary | ICD-10-CM | POA: Diagnosis not present

## 2019-03-26 DIAGNOSIS — G309 Alzheimer's disease, unspecified: Secondary | ICD-10-CM | POA: Diagnosis not present

## 2019-03-26 DIAGNOSIS — Z9181 History of falling: Secondary | ICD-10-CM | POA: Diagnosis not present

## 2019-03-28 ENCOUNTER — Other Ambulatory Visit: Payer: Self-pay | Admitting: Family Medicine

## 2019-03-28 DIAGNOSIS — Z9181 History of falling: Secondary | ICD-10-CM | POA: Diagnosis not present

## 2019-03-28 DIAGNOSIS — G301 Alzheimer's disease with late onset: Secondary | ICD-10-CM | POA: Diagnosis not present

## 2019-03-28 DIAGNOSIS — F0280 Dementia in other diseases classified elsewhere without behavioral disturbance: Secondary | ICD-10-CM | POA: Diagnosis not present

## 2019-03-28 DIAGNOSIS — R54 Age-related physical debility: Secondary | ICD-10-CM | POA: Diagnosis not present

## 2019-03-28 DIAGNOSIS — G309 Alzheimer's disease, unspecified: Secondary | ICD-10-CM | POA: Diagnosis not present

## 2019-03-28 NOTE — Telephone Encounter (Signed)
He is now in a memory care unit nursing home- so not sure why we keep getting requests to refill  his meds???

## 2019-03-29 NOTE — Telephone Encounter (Signed)
Wife is still wanting to have a discussion on husband's meds [phone] her name Elvin So.

## 2019-04-02 DIAGNOSIS — G301 Alzheimer's disease with late onset: Secondary | ICD-10-CM | POA: Diagnosis not present

## 2019-04-02 DIAGNOSIS — R54 Age-related physical debility: Secondary | ICD-10-CM | POA: Diagnosis not present

## 2019-04-02 DIAGNOSIS — Z9181 History of falling: Secondary | ICD-10-CM | POA: Diagnosis not present

## 2019-04-02 DIAGNOSIS — F0280 Dementia in other diseases classified elsewhere without behavioral disturbance: Secondary | ICD-10-CM | POA: Diagnosis not present

## 2019-04-02 DIAGNOSIS — G309 Alzheimer's disease, unspecified: Secondary | ICD-10-CM | POA: Diagnosis not present

## 2019-04-03 ENCOUNTER — Other Ambulatory Visit: Payer: Self-pay | Admitting: *Deleted

## 2019-04-03 ENCOUNTER — Telehealth: Payer: Self-pay

## 2019-04-03 DIAGNOSIS — D649 Anemia, unspecified: Secondary | ICD-10-CM | POA: Diagnosis not present

## 2019-04-03 DIAGNOSIS — G309 Alzheimer's disease, unspecified: Secondary | ICD-10-CM | POA: Diagnosis not present

## 2019-04-03 DIAGNOSIS — E785 Hyperlipidemia, unspecified: Secondary | ICD-10-CM | POA: Diagnosis not present

## 2019-04-03 NOTE — Telephone Encounter (Signed)
Spoke with [place] at [facility]. Per Varney Biles she spoke with Ernestine last Saturday and the house Doctor discontinued this medication.   Clinic RN then called [facility]. Per Nira Conn medication was discontinued on 03/29/2019. The bill the family received was for September 2020 because they bill after the fact.  Clinic RN then called  Ernestine and informed her of this information. Ernestine verbalized understanding.

## 2019-04-03 NOTE — Telephone Encounter (Signed)
Dr. Elease Hashimoto, please advise, I was off on Friday unsure if someone gave you a letter in regards to this pt

## 2019-04-03 NOTE — Telephone Encounter (Signed)
Thank you for checking on this.

## 2019-04-03 NOTE — Telephone Encounter (Signed)
I gave this to Dennis Harper to clarify.  She was supposed to be checking with the nursing facility.

## 2019-04-03 NOTE — Telephone Encounter (Signed)
Copied from [place] [phone]. Topic: General - Inquiry >> Apr 03, 2019 10:40 AM Percell Belt A wrote: Reason for CRM: pt wife called in and stated that she dropped of a letter for Dr Elease Hashimoto last week (Friday).  She stated that she expected a call back.  She is getting charged for meds that pt is not even taking.  She would like get help with cancelling the meds he does not need.  Best number [phone]

## 2019-04-04 ENCOUNTER — Other Ambulatory Visit: Payer: Self-pay | Admitting: Family Medicine

## 2019-04-04 DIAGNOSIS — F0280 Dementia in other diseases classified elsewhere without behavioral disturbance: Secondary | ICD-10-CM | POA: Diagnosis not present

## 2019-04-04 DIAGNOSIS — G309 Alzheimer's disease, unspecified: Secondary | ICD-10-CM | POA: Diagnosis not present

## 2019-04-04 DIAGNOSIS — G301 Alzheimer's disease with late onset: Secondary | ICD-10-CM | POA: Diagnosis not present

## 2019-04-04 DIAGNOSIS — R54 Age-related physical debility: Secondary | ICD-10-CM | POA: Diagnosis not present

## 2019-04-04 DIAGNOSIS — Z9181 History of falling: Secondary | ICD-10-CM | POA: Diagnosis not present

## 2019-04-10 NOTE — Telephone Encounter (Signed)
There was another phone visit in regards to this. Closing this message

## 2019-04-10 NOTE — Telephone Encounter (Signed)
This pt is in a nursing home and I am not sure why we are still getting requests for his medication refills?

## 2019-04-11 NOTE — Telephone Encounter (Signed)
Attempted to reach wife on # provided. Line just rangs a fast busy signal

## 2019-04-17 DIAGNOSIS — E876 Hypokalemia: Secondary | ICD-10-CM | POA: Diagnosis not present

## 2019-04-17 DIAGNOSIS — E785 Hyperlipidemia, unspecified: Secondary | ICD-10-CM | POA: Diagnosis not present

## 2019-04-17 DIAGNOSIS — G309 Alzheimer's disease, unspecified: Secondary | ICD-10-CM | POA: Diagnosis not present

## 2019-04-17 DIAGNOSIS — D649 Anemia, unspecified: Secondary | ICD-10-CM | POA: Diagnosis not present

## 2019-04-17 DIAGNOSIS — R569 Unspecified convulsions: Secondary | ICD-10-CM | POA: Diagnosis not present

## 2019-04-18 ENCOUNTER — Emergency Department (HOSPITAL_COMMUNITY): Payer: Medicare Other

## 2019-04-18 ENCOUNTER — Other Ambulatory Visit: Payer: Self-pay

## 2019-04-18 ENCOUNTER — Emergency Department (HOSPITAL_COMMUNITY)
Admission: EM | Admit: 2019-04-18 | Discharge: 2019-04-19 | Disposition: A | Discharge status: Home / Self Care | Payer: Medicare Other | Attending: Emergency Medicine | Admitting: Emergency Medicine

## 2019-04-18 ENCOUNTER — Encounter (HOSPITAL_COMMUNITY): Payer: Self-pay

## 2019-04-18 DIAGNOSIS — Z79899 Other long term (current) drug therapy: Secondary | ICD-10-CM | POA: Diagnosis not present

## 2019-04-18 DIAGNOSIS — Y999 Unspecified external cause status: Secondary | ICD-10-CM | POA: Insufficient documentation

## 2019-04-18 DIAGNOSIS — G309 Alzheimer's disease, unspecified: Secondary | ICD-10-CM | POA: Insufficient documentation

## 2019-04-18 DIAGNOSIS — R404 Transient alteration of awareness: Secondary | ICD-10-CM | POA: Diagnosis not present

## 2019-04-18 DIAGNOSIS — Y939 Activity, unspecified: Secondary | ICD-10-CM | POA: Insufficient documentation

## 2019-04-18 DIAGNOSIS — F0390 Unspecified dementia without behavioral disturbance: Secondary | ICD-10-CM | POA: Diagnosis not present

## 2019-04-18 DIAGNOSIS — W19XXXA Unspecified fall, initial encounter: Secondary | ICD-10-CM | POA: Diagnosis not present

## 2019-04-18 DIAGNOSIS — Y92122 Bedroom in nursing home as the place of occurrence of the external cause: Secondary | ICD-10-CM | POA: Insufficient documentation

## 2019-04-18 DIAGNOSIS — S299XXA Unspecified injury of thorax, initial encounter: Secondary | ICD-10-CM | POA: Diagnosis not present

## 2019-04-18 DIAGNOSIS — F17210 Nicotine dependence, cigarettes, uncomplicated: Secondary | ICD-10-CM | POA: Diagnosis not present

## 2019-04-18 DIAGNOSIS — Z043 Encounter for examination and observation following other accident: Secondary | ICD-10-CM | POA: Insufficient documentation

## 2019-04-18 DIAGNOSIS — R4182 Altered mental status, unspecified: Secondary | ICD-10-CM | POA: Diagnosis not present

## 2019-04-18 LAB — CBC WITH DIFFERENTIAL/PLATELET
Abs Immature Granulocytes: 0.03 10*3/uL (ref 0.00–0.07)
Basophils Absolute: 0 10*3/uL (ref 0.0–0.1)
Basophils Relative: 0 %
Eosinophils Absolute: 0 10*3/uL (ref 0.0–0.5)
Eosinophils Relative: 0 %
HCT: 33.7 % — ABNORMAL LOW (ref 39.0–52.0)
Hemoglobin: 10.1 g/dL — ABNORMAL LOW (ref 13.0–17.0)
Immature Granulocytes: 0 %
Lymphocytes Relative: 15 %
Lymphs Abs: 1.2 10*3/uL (ref 0.7–4.0)
MCH: 30.1 pg (ref 26.0–34.0)
MCHC: 30 g/dL (ref 30.0–36.0)
MCV: 100.6 fL — ABNORMAL HIGH (ref 80.0–100.0)
Monocytes Absolute: 0.8 10*3/uL (ref 0.1–1.0)
Monocytes Relative: 10 %
Neutro Abs: 5.9 10*3/uL (ref 1.7–7.7)
Neutrophils Relative %: 75 %
Platelets: 190 10*3/uL (ref 150–400)
RBC: 3.35 MIL/uL — ABNORMAL LOW (ref 4.22–5.81)
RDW: 13.5 % (ref 11.5–15.5)
WBC: 7.9 10*3/uL (ref 4.0–10.5)
nRBC: 0 % (ref 0.0–0.2)

## 2019-04-18 LAB — URINALYSIS, ROUTINE W REFLEX MICROSCOPIC
Bacteria, UA: NONE SEEN
Bilirubin Urine: NEGATIVE
Glucose, UA: NEGATIVE mg/dL
Ketones, ur: NEGATIVE mg/dL
Leukocytes,Ua: NEGATIVE
Nitrite: NEGATIVE
Protein, ur: NEGATIVE mg/dL
Specific Gravity, Urine: 1.023 (ref 1.005–1.030)
pH: 7 (ref 5.0–8.0)

## 2019-04-18 LAB — BASIC METABOLIC PANEL
Anion gap: 6 (ref 5–15)
BUN: 30 mg/dL — ABNORMAL HIGH (ref 8–23)
CO2: 30 mmol/L (ref 22–32)
Calcium: 8.5 mg/dL — ABNORMAL LOW (ref 8.9–10.3)
Chloride: 109 mmol/L (ref 98–111)
Creatinine, Ser: 0.79 mg/dL (ref 0.61–1.24)
GFR calc Af Amer: >60 mL/min (ref >60)
GFR calc non Af Amer: >60 mL/min (ref >60)
Glucose, Bld: 100 mg/dL — ABNORMAL HIGH (ref 70–99)
Potassium: 3.6 mmol/L (ref 3.5–5.1)
Sodium: 145 mmol/L (ref 135–145)

## 2019-04-18 NOTE — ED Notes (Signed)
Notified [facility] for transport from [facility] to [facility].

## 2019-04-18 NOTE — ED Notes (Signed)
Pt in CT.

## 2019-04-18 NOTE — ED Notes (Signed)
Gave report to Charlie Pitter, med tech with Demetrius Charity at [facility].

## 2019-04-18 NOTE — ED Triage Notes (Signed)
Pt BIB EMS from [facility], assisted living. Facility reports unwitnessed fall. Pt family requests patient get checked out. Pt has had multiple falls. Pt A&Ox1 at baseline. Pt not bleeding.   130/68 99% RA

## 2019-04-18 NOTE — ED Provider Notes (Signed)
[facility] DEPT Provider Note   CSN: [id number] Arrival date & time: 04/18/19  1834     History   Chief Complaint Chief Complaint  Patient presents with  . Fall    HPI Thomas Loudon Konda is a 78 y.o. male.  Presents emergency room with chief complaint recall.  Came facility, reportedly mental baseline.  Unwitnessed, no obvious signs of trauma reported.  History limited due to baseline dementia.  On questioning patient has no acute complaints.     HPI  Past Medical History:  Diagnosis Date  . Alzheimer's dementia ([place])   . Anxiety   . GALLSTONES 01/06/2010   Qualifier: Diagnosis of  By: Elease Hashimoto MD, Bruce    . GERD (gastroesophageal reflux disease)   . 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 Acute bronchitis 04/30/2015  . Memory loss 04/16/2015  . Alzheimer's dementia ([place]) 09/16/2014  . Right inguinal hernia 08/23/2014  . GERD (gastroesophageal reflux disease) 01/12/2012  . 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 GALLSTONES 01/06/2010  . BENIGN POSITIONAL VERTIGO 12/20/2008  . PARESTHESIA 12/20/2008    Past Surgical History:  Procedure Laterality Date  . APPENDECTOMY     age 82  . INGUINAL HERNIA REPAIR Right 08/06/2015   Procedure: RIGHT INGUINAL HERNIA REPAIR WITH MESH;  Surgeon: Coralie Keens, MD;  Location: [facility];  Service: General;  Laterality: Right;  . INSERTION OF MESH Right 08/06/2015   Procedure: INSERTION OF MESH;  Surgeon: Coralie Keens, MD;  Location: [facility];  Service: General;  Laterality: Right;        Home Medications    Prior to Admission medications   Medication Sig Start Date End Date Taking? Authorizing Provider  citalopram (CELEXA) 20 MG tablet Take 20 mg by mouth daily.    [provider]  donepezil (ARICEPT) 10 MG tablet Take 10 mg by mouth  at bedtime.    [provider]  LORazepam (ATIVAN) 0.5 MG tablet Take 1 tablet (0.5 mg total) by mouth every 8 (eight) hours as needed for anxiety. 02/07/19   Burchette, Alinda Sierras, MD  risperiDONE (RISPERDAL) 0.5 MG tablet Take 1 tablet (0.5 mg total) by mouth 2 (two) times daily. 10/20/18   Burchette, Alinda Sierras, MD    Family History Family History  Problem Relation Age of Onset  . Alcohol abuse Neg Hx        family hx  . Heart disease Neg Hx        family hx    Social History Social History   Tobacco Use  . Smoking status: Current Some Day Smoker    Types: Cigars  . Smokeless tobacco: Never Used  . Tobacco comment: smokes occ cigar 2-3 time/week  Substance Use Topics  . Alcohol use: Yes    Alcohol/week: 0.0 standard drinks    Comment: social  . Drug use: No     Allergies   Patient has no known allergies.   Review of Systems Review of Systems  Unable to perform ROS: Dementia     Physical Exam Updated Vital Signs BP (!) 118/52 (BP Location: Left Arm)   Pulse (!) 59   Temp 98.4 F (36.9 C) (Oral)   Resp 18   SpO2 100%   Physical Exam Constitutional:      Comments: Pleasantly demented, no acute distress  HENT:  Head: Normocephalic and atraumatic.     Nose: Nose normal.     Mouth/Throat:     Mouth: Mucous membranes are dry.  Eyes:     Extraocular Movements: Extraocular movements intact.     Pupils: Pupils are equal, round, and reactive to light.  Neck:     Comments: No tenderness palpation over C-spine, normal neck range of motion Cardiovascular:     Rate and Rhythm: Normal rate.     Pulses: Normal pulses.     Heart sounds: Normal heart sounds.  Pulmonary:     Effort: Pulmonary effort is normal. No respiratory distress.     Breath sounds: Normal breath sounds.  Abdominal:     General: Abdomen is flat. Bowel sounds are normal. There is no distension.  Musculoskeletal:     Comments: No shortness palpation over T, L-spine  No tenderness  palpation throughout all 4 extremities, no obvious trauma or deformity noted  Skin:    General: Skin is warm and dry.     Capillary Refill: Capillary refill takes less than 2 seconds.  Neurological:     Comments: Alert, oriented x1, follows commands, all 4 extremities moving spontaneously      ED Treatments / Results  Labs (all labs ordered are listed, but only abnormal results are displayed) Labs Reviewed  URINALYSIS, ROUTINE W REFLEX MICROSCOPIC - Abnormal; Notable for the following components:      Result Value   Hgb urine dipstick SMALL (*)    All other components within normal limits  CBC WITH DIFFERENTIAL/PLATELET - Abnormal; Notable for the following components:   RBC 3.35 (*)    Hemoglobin 10.1 (*)    HCT 33.7 (*)    MCV 100.6 (*)    All other components within normal limits  BASIC METABOLIC PANEL - Abnormal; Notable for the following components:   Glucose, Bld 100 (*)    BUN 30 (*)    Calcium 8.5 (*)    All other components within normal limits    EKG EKG Interpretation  Date/Time:  Wednesday April 18 2019 21:13:29 EST Ventricular Rate:  66 PR Interval:    QRS Duration: 91 QT Interval:  410 QTC Calculation: 430 R Axis:   71 Text Interpretation: Sinus rhythm Confirmed by Madalyn Rob [phone]) on 04/18/2019 10:28:45 PM   Radiology Ct Head Wo Contrast  Result Date: 04/18/2019 CLINICAL DATA:  Fall, altered mental status EXAM: CT HEAD WITHOUT CONTRAST TECHNIQUE: Contiguous axial images were obtained from the base of the skull through the vertex without intravenous contrast. COMPARISON:  12/19/2018 FINDINGS: Brain: There is atrophy and chronic small vessel disease changes. No acute intracranial abnormality. Specifically, no hemorrhage, hydrocephalus, mass lesion, acute infarction, or significant intracranial injury. Vascular: No hyperdense vessel or unexpected calcification. Skull: No acute calvarial abnormality. Sinuses/Orbits: Visualized paranasal sinuses and  mastoids clear. Orbital soft tissues unremarkable. Other: None IMPRESSION: Atrophy, chronic microvascular disease. No acute intracranial abnormality. Electronically Signed   By: Rolm Baptise M.D.   On: 04/18/2019 21:57   Dg Chest Portable 1 View  Result Date: 04/18/2019 CLINICAL DATA:  Fall EXAM: PORTABLE CHEST 1 VIEW COMPARISON:  02/14/2019 FINDINGS: The heart size and mediastinal contours are within normal limits. Both lungs are clear. The visualized skeletal structures are unremarkable. IMPRESSION: No active disease. Electronically Signed   By: Donavan Foil M.D.   On: 04/18/2019 21:19    Procedures Procedures (including critical care time)  Medications Ordered in ED Medications - No data to display   Initial  Impression / Assessment and Plan / ED Course  I have reviewed the triage vital signs and the nursing notes.  Pertinent labs & imaging results that were available during my care of the patient were reviewed by me and considered in my medical decision making (see chart for details).  Clinical Course as of Apr 17 2304  Wed Apr 18, 2019  2233 Reviewed labs, imaging, will dc back to facility   [RD]    Clinical Course User Index [RD] Lucrezia Starch, MD       78 year old male presents to ER after unwitnessed fall.  History of dementia, at mental baseline by report.  No obvious trauma noted on my exam.  Screening labs within normal limits.  CT head negative, screening chest x-ray negative.  Will discharge back to facility.    After the discussed management above, the patient was determined to be safe for discharge.  The patient was in agreement with this plan and all questions regarding their care were answered.  ED return precautions were discussed and the patient will return to the ED with any significant worsening of condition.    Final Clinical Impressions(s) / ED Diagnoses   Final diagnoses:  Fall, initial encounter    ED Discharge Orders    None        Lucrezia Starch, MD 04/18/19 2307

## 2019-04-18 NOTE — Discharge Instructions (Signed)
Recommend recheck with primary doctor later this week.  If he has any further falls, develops any confusion, weakness, fevers, difficulty breathing or other new concerning symptom recommend return to ER for recheck.

## 2019-04-19 DIAGNOSIS — Z743 Need for continuous supervision: Secondary | ICD-10-CM | POA: Diagnosis not present

## 2019-04-19 DIAGNOSIS — R279 Unspecified lack of coordination: Secondary | ICD-10-CM | POA: Diagnosis not present

## 2019-04-19 DIAGNOSIS — R4182 Altered mental status, unspecified: Secondary | ICD-10-CM | POA: Diagnosis not present

## 2019-04-19 DIAGNOSIS — R5381 Other malaise: Secondary | ICD-10-CM | POA: Diagnosis not present

## 2019-04-20 DIAGNOSIS — M545 Low back pain: Secondary | ICD-10-CM | POA: Diagnosis not present

## 2019-04-20 DIAGNOSIS — M25551 Pain in right hip: Secondary | ICD-10-CM | POA: Diagnosis not present

## 2019-04-23 ENCOUNTER — Encounter (HOSPITAL_COMMUNITY): Payer: Self-pay

## 2019-04-23 ENCOUNTER — Other Ambulatory Visit: Payer: Self-pay

## 2019-04-23 ENCOUNTER — Emergency Department (HOSPITAL_COMMUNITY)
Admission: EM | Admit: 2019-04-23 | Discharge: 2019-04-23 | Disposition: A | Discharge status: Home / Self Care | Payer: Medicare Other | Attending: Emergency Medicine | Admitting: Emergency Medicine

## 2019-04-23 ENCOUNTER — Emergency Department (HOSPITAL_COMMUNITY): Payer: Medicare Other

## 2019-04-23 DIAGNOSIS — Z79899 Other long term (current) drug therapy: Secondary | ICD-10-CM | POA: Diagnosis not present

## 2019-04-23 DIAGNOSIS — G309 Alzheimer's disease, unspecified: Secondary | ICD-10-CM | POA: Insufficient documentation

## 2019-04-23 DIAGNOSIS — R404 Transient alteration of awareness: Secondary | ICD-10-CM | POA: Diagnosis not present

## 2019-04-23 DIAGNOSIS — F17290 Nicotine dependence, other tobacco product, uncomplicated: Secondary | ICD-10-CM | POA: Insufficient documentation

## 2019-04-23 DIAGNOSIS — Z7401 Bed confinement status: Secondary | ICD-10-CM | POA: Diagnosis not present

## 2019-04-23 DIAGNOSIS — I959 Hypotension, unspecified: Secondary | ICD-10-CM | POA: Diagnosis not present

## 2019-04-23 DIAGNOSIS — W19XXXA Unspecified fall, initial encounter: Secondary | ICD-10-CM | POA: Diagnosis not present

## 2019-04-23 DIAGNOSIS — F0280 Dementia in other diseases classified elsewhere without behavioral disturbance: Secondary | ICD-10-CM | POA: Diagnosis not present

## 2019-04-23 DIAGNOSIS — R410 Disorientation, unspecified: Secondary | ICD-10-CM | POA: Diagnosis not present

## 2019-04-23 DIAGNOSIS — R4182 Altered mental status, unspecified: Secondary | ICD-10-CM | POA: Diagnosis not present

## 2019-04-23 DIAGNOSIS — R531 Weakness: Secondary | ICD-10-CM | POA: Diagnosis not present

## 2019-04-23 DIAGNOSIS — M2550 Pain in unspecified joint: Secondary | ICD-10-CM | POA: Diagnosis not present

## 2019-04-23 LAB — COMPREHENSIVE METABOLIC PANEL
ALT: 12 U/L (ref 0–44)
AST: 17 U/L (ref 15–41)
Albumin: 3.2 g/dL — ABNORMAL LOW (ref 3.5–5.0)
Alkaline Phosphatase: 45 U/L (ref 38–126)
Anion gap: 7 (ref 5–15)
BUN: 25 mg/dL — ABNORMAL HIGH (ref 8–23)
CO2: 28 mmol/L (ref 22–32)
Calcium: 8.3 mg/dL — ABNORMAL LOW (ref 8.9–10.3)
Chloride: 107 mmol/L (ref 98–111)
Creatinine, Ser: 0.83 mg/dL (ref 0.61–1.24)
GFR calc Af Amer: >60 mL/min (ref >60)
GFR calc non Af Amer: >60 mL/min (ref >60)
Glucose, Bld: 96 mg/dL (ref 70–99)
Potassium: 4.1 mmol/L (ref 3.5–5.1)
Sodium: 142 mmol/L (ref 135–145)
Total Bilirubin: 0.7 mg/dL (ref 0.3–1.2)
Total Protein: 6.4 g/dL — ABNORMAL LOW (ref 6.5–8.1)

## 2019-04-23 LAB — URINALYSIS, ROUTINE W REFLEX MICROSCOPIC
Bilirubin Urine: NEGATIVE
Glucose, UA: NEGATIVE mg/dL
Hgb urine dipstick: NEGATIVE
Ketones, ur: NEGATIVE mg/dL
Leukocytes,Ua: NEGATIVE
Nitrite: NEGATIVE
Protein, ur: NEGATIVE mg/dL
Specific Gravity, Urine: 1.025 (ref 1.005–1.030)
pH: 6 (ref 5.0–8.0)

## 2019-04-23 LAB — CBC WITH DIFFERENTIAL/PLATELET
Abs Immature Granulocytes: 0.02 10*3/uL (ref 0.00–0.07)
Basophils Absolute: 0 10*3/uL (ref 0.0–0.1)
Basophils Relative: 0 %
Eosinophils Absolute: 0 10*3/uL (ref 0.0–0.5)
Eosinophils Relative: 0 %
HCT: 34.9 % — ABNORMAL LOW (ref 39.0–52.0)
Hemoglobin: 10.6 g/dL — ABNORMAL LOW (ref 13.0–17.0)
Immature Granulocytes: 0 %
Lymphocytes Relative: 19 %
Lymphs Abs: 1.7 10*3/uL (ref 0.7–4.0)
MCH: 30.1 pg (ref 26.0–34.0)
MCHC: 30.4 g/dL (ref 30.0–36.0)
MCV: 99.1 fL (ref 80.0–100.0)
Monocytes Absolute: 0.7 10*3/uL (ref 0.1–1.0)
Monocytes Relative: 8 %
Neutro Abs: 6.4 10*3/uL (ref 1.7–7.7)
Neutrophils Relative %: 73 %
Platelets: 193 10*3/uL (ref 150–400)
RBC: 3.52 MIL/uL — ABNORMAL LOW (ref 4.22–5.81)
RDW: 13 % (ref 11.5–15.5)
WBC: 8.9 10*3/uL (ref 4.0–10.5)
nRBC: 0 % (ref 0.0–0.2)

## 2019-04-23 LAB — LACTIC ACID, PLASMA: Lactic Acid, Venous: 0.7 mmol/L (ref 0.5–1.9)

## 2019-04-23 LAB — VALPROIC ACID LEVEL: Valproic Acid Lvl: 12 ug/mL — ABNORMAL LOW (ref 50.0–100.0)

## 2019-04-23 LAB — LIPASE, BLOOD: Lipase: 34 U/L (ref 11–51)

## 2019-04-23 LAB — PROTIME-INR
INR: 1.1 (ref 0.8–1.2)
Prothrombin Time: 13.8 seconds (ref 11.4–15.2)

## 2019-04-23 LAB — AMMONIA: Ammonia: <9 umol/L — ABNORMAL LOW (ref 9–35)

## 2019-04-23 LAB — TROPONIN I (HIGH SENSITIVITY): Troponin I (High Sensitivity): 4 ng/L (ref <18)

## 2019-04-23 NOTE — ED Notes (Signed)
[facility] called for transport.  

## 2019-04-23 NOTE — ED Triage Notes (Signed)
Pt arrived via [facility] from "[facility] [place]" CC AMS possibly r/t fall 5 days ago. Seen at [facility] for fall and released without complication. Staff states reports "progrssed confusion and generalized weakness." No DNR forms present pt has history full code.   Per EMS No facula droop A/OX0 18 Left AC  Hx Alzheimer, Dementia, GERD, Falls

## 2019-04-23 NOTE — ED Provider Notes (Signed)
[facility] DEPT Provider Note   CSN: [id number] Arrival date & time: 04/23/19  1301     History   Chief Complaint Chief Complaint  Patient presents with  . Altered Mental Status    HPI Dennis Harper is a 78 y.o. male.     HPI Patient has history significant for Alzheimer's dementia.  At baseline this is fairly advanced.  I talked to the patient's wife.  She reports sometimes he recognizes her and other times he does not.  He reports sometimes he is very quiet and does not respond verbally.  Patient was seen in the emergency department 5 days ago for an unwitnessed fall.  He had a CT scan of the head which was negative.  He was sent back to the facility.  From facility there is reported concern for a change in mental status possibly associated with the patient's fall.  Patient denies any complaints.  He denies he is experiencing any pain.  He reports he feels fine.  [name] wife called while I was in the room.  I was able to hold the phone for him and he had a simple but appropriate conversation with his wife. Past Medical History:  Diagnosis Date  . Alzheimer's dementia ([place])   . Anxiety   . GALLSTONES 01/06/2010   Qualifier: Diagnosis of  By: Elease Hashimoto MD, Bruce    . GERD (gastroesophageal reflux disease)   . 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 Acute bronchitis 04/30/2015  . Memory loss 04/16/2015  . Alzheimer's dementia ([place]) 09/16/2014  . Right inguinal hernia 08/23/2014  . GERD (gastroesophageal reflux disease) 01/12/2012  . 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 GALLSTONES 01/06/2010  . BENIGN POSITIONAL VERTIGO 12/20/2008  . PARESTHESIA 12/20/2008    Past Surgical History:  Procedure Laterality Date  . APPENDECTOMY     age 7  . INGUINAL HERNIA REPAIR Right 08/06/2015   Procedure: RIGHT INGUINAL HERNIA REPAIR  WITH MESH;  Surgeon: Coralie Keens, MD;  Location: [facility];  Service: General;  Laterality: Right;  . INSERTION OF MESH Right 08/06/2015   Procedure: INSERTION OF MESH;  Surgeon: Coralie Keens, MD;  Location: [facility];  Service: General;  Laterality: Right;        Home Medications    Prior to Admission medications   Medication Sig Start Date End Date Taking? Authorizing Provider  citalopram (CELEXA) 20 MG tablet Take 20 mg by mouth daily.   Yes [provider]  divalproex (DEPAKOTE SPRINKLE) 125 MG capsule Take 125 mg by mouth 2 (two) times daily. At 1400 and 2000 04/09/19  Yes [provider]  donepezil (ARICEPT) 10 MG tablet Take 10 mg by mouth at bedtime.   Yes [provider]  LORazepam (ATIVAN) 0.5 MG tablet Take 1 tablet (0.5 mg total) by mouth every 8 (eight) hours as needed for anxiety. 02/07/19  Yes Burchette, Alinda Sierras, MD  LORazepam (ATIVAN) 1 MG tablet Take 0.5 mg by mouth daily. Daily at 1300 04/07/19  Yes [provider]  risperiDONE (RISPERDAL) 0.5 MG tablet Take 1 tablet (0.5 mg total) by mouth 2 (two) times daily. 10/20/18  Yes Burchette, Alinda Sierras, MD    Family History Family History  Problem Relation Age of Onset  . Alcohol abuse Neg Hx        family  hx  . Heart disease Neg Hx        family hx    Social History Social History   Tobacco Use  . Smoking status: Current Some Day Smoker    Types: Cigars  . Smokeless tobacco: Never Used  . Tobacco comment: smokes occ cigar 2-3 time/week  Substance Use Topics  . Alcohol use: Yes    Alcohol/week: 0.0 standard drinks    Comment: social  . Drug use: No     Allergies   Patient has no known allergies.   Review of Systems Review of Systems Level 5 caveat cannot obtain review of systems due to dementia.  Physical Exam Updated Vital Signs BP 131/67   Pulse (!) 58   Temp 98.2 F (36.8 C) (Oral)   Resp (!) 23   SpO2 100%   Physical  Exam Constitutional:      Comments: Alert and nontoxic.  He is answering simple questions with short answers.  No respiratory distress.  Nontoxic.  Vital signs stable.  HENT:     Head: Normocephalic and atraumatic.     Nose: Nose normal.     Mouth/Throat:     Mouth: Mucous membranes are moist.     Pharynx: Oropharynx is clear.  Eyes:     Extraocular Movements: Extraocular movements intact.     Pupils: Pupils are equal, round, and reactive to light.  Neck:     Musculoskeletal: Neck supple.  Cardiovascular:     Rate and Rhythm: Normal rate and regular rhythm.     Pulses: Normal pulses.     Heart sounds: Normal heart sounds.  Pulmonary:     Effort: Pulmonary effort is normal.     Breath sounds: Normal breath sounds.  Chest:     Chest wall: No tenderness.  Abdominal:     General: There is no distension.     Palpations: Abdomen is soft.     Tenderness: There is no abdominal tenderness. There is no guarding.  Musculoskeletal: Normal range of motion.        General: No swelling, tenderness, deformity or signs of injury.     Right lower leg: No edema.     Left lower leg: No edema.  Skin:    General: Skin is warm and dry.  Neurological:     Comments: Patient is alert in appearance.  He has a very quiet demeanor.  He answers questions with a short simple sentence.  He is slow to follow any commands.  If I ask him to move his arms or do something he answers in the affirmative but then does not move forward with the actions.  No focal motor deficits.      ED Treatments / Results  Labs (all labs ordered are listed, but only abnormal results are displayed) Labs Reviewed  CBC WITH DIFFERENTIAL/PLATELET - Abnormal; Notable for the following components:      Result Value   RBC 3.52 (*)    Hemoglobin 10.6 (*)    HCT 34.9 (*)    All other components within normal limits  VALPROIC ACID LEVEL - Abnormal; Notable for the following components:   Valproic Acid Lvl 12 (*)    All other  components within normal limits  AMMONIA - Abnormal; Notable for the following components:   Ammonia <9 (*)    All other components within normal limits  LACTIC ACID, PLASMA  PROTIME-INR  COMPREHENSIVE METABOLIC PANEL  LIPASE, BLOOD  LACTIC ACID, PLASMA  URINALYSIS, ROUTINE W REFLEX  MICROSCOPIC  TROPONIN I (HIGH SENSITIVITY)  TROPONIN I (HIGH SENSITIVITY)    EKG None  Radiology Ct Head Wo Contrast  Result Date: 04/23/2019 CLINICAL DATA:  Altered mental status, fell 5 days ago EXAM: CT HEAD WITHOUT CONTRAST TECHNIQUE: Contiguous axial images were obtained from the base of the skull through the vertex without intravenous contrast. Sagittal and coronal MPR images reconstructed from axial data set. COMPARISON:  04/18/2019 FINDINGS: Brain: Generalized atrophy. Normal ventricular morphology. No midline shift or mass effect. Small vessel chronic ischemic changes of deep cerebral white matter. No intracranial hemorrhage, mass lesion, evidence of acute infarction, or extra-axial fluid collection. Vascular: No hyperdense vessels Skull: Demineralized but intact Sinuses/Orbits: Clear Other: N/A IMPRESSION: Atrophy with small vessel chronic ischemic changes of deep cerebral white matter. No acute intracranial abnormalities. Electronically Signed   By: Lavonia Dana M.D.   On: 04/23/2019 15:21    Procedures Procedures (including critical care time)  Medications Ordered in ED Medications - No data to display   Initial Impression / Assessment and Plan / ED Course  I have reviewed the triage vital signs and the nursing notes.  Pertinent labs & imaging results that were available during my care of the patient were reviewed by me and considered in my medical decision making (see chart for details).       Patient is brought to the emergency department as outlined above.  At this time I do not identify any specific signs of complications of head injury.  CT scan does not show any delayed bleeding.   Patient does not appear ill.  His vital signs are stable.  Will obtain basic screening labs to rule out any complications of renal dysfunction, dehydration, anemia, UTI.  Dr. Sedonia Small will review the final diagnostic studies.  Given patient's condition which appears to be at baseline per my conversaton with his wife and his generally clinically stable appearance, will plan for return to nursing home care.  Final Clinical Impressions(s) / ED Diagnoses   Final diagnoses:  Alzheimer's dementia without behavioral disturbance, unspecified timing of dementia onset [facility])    ED Discharge Orders    None       Charlesetta Shanks, MD 04/23/19 [phone]

## 2019-04-23 NOTE — ED Provider Notes (Addendum)
  Provider Note MRN:  [id number]  Arrival date & time: 04/23/19    ED Course and Medical Decision Making  Assumed care from Dr. Vallery Ridge at shift change.  Question of altered mental status from facility, but favoring symptoms related to underlying dementia.  Awaiting labs, anticipating discharge.  5:45 PM update: Labs reassuring, urinalysis with no evidence of infection, appropriate for discharge.  Procedures  Final Clinical Impressions(s) / ED Diagnoses     ICD-10-CM   1. Alzheimer's dementia without behavioral disturbance, unspecified timing of dementia onset ([place])  G30.9    F02.80     ED Discharge Orders    None      Discharge Instructions   None     Barth Kirks. Sedonia Small, [place] [email or web address]    Maudie Flakes, MD 04/23/19 1646    Maudie Flakes, MD 04/23/19 1744

## 2019-04-23 NOTE — Discharge Instructions (Signed)
1.  At this time no specific problems are identified.  The patient has been alert with stable vital signs.  Basic lab work was obtained including a repeat CT scan of the head.  No significant abnormalities identified. 2.  Continue to care for the patient and give regular medications.  Have close follow-up with the facility provider.  Bring the patient back if he is not eating or drinking, develops fever or other concerning symptoms.

## 2019-04-26 DIAGNOSIS — F0281 Dementia in other diseases classified elsewhere with behavioral disturbance: Secondary | ICD-10-CM | POA: Diagnosis not present

## 2019-04-26 DIAGNOSIS — R5381 Other malaise: Secondary | ICD-10-CM | POA: Diagnosis not present

## 2019-05-03 DIAGNOSIS — R64 Cachexia: Secondary | ICD-10-CM | POA: Diagnosis not present

## 2019-05-15 DIAGNOSIS — R569 Unspecified convulsions: Secondary | ICD-10-CM | POA: Diagnosis not present

## 2019-05-15 DIAGNOSIS — E876 Hypokalemia: Secondary | ICD-10-CM | POA: Diagnosis not present

## 2019-05-15 DIAGNOSIS — E785 Hyperlipidemia, unspecified: Secondary | ICD-10-CM | POA: Diagnosis not present

## 2019-05-15 DIAGNOSIS — G309 Alzheimer's disease, unspecified: Secondary | ICD-10-CM | POA: Diagnosis not present

## 2019-05-15 DIAGNOSIS — D649 Anemia, unspecified: Secondary | ICD-10-CM | POA: Diagnosis not present

## 2019-05-17 DIAGNOSIS — R64 Cachexia: Secondary | ICD-10-CM | POA: Diagnosis not present

## 2019-05-21 DIAGNOSIS — Z20828 Contact with and (suspected) exposure to other viral communicable diseases: Secondary | ICD-10-CM | POA: Diagnosis not present

## 2019-05-21 DIAGNOSIS — Z1383 Encounter for screening for respiratory disorder NEC: Secondary | ICD-10-CM | POA: Diagnosis not present

## 2019-05-21 DIAGNOSIS — R05 Cough: Secondary | ICD-10-CM | POA: Diagnosis not present

## 2019-06-06 ENCOUNTER — Telehealth: Payer: Self-pay | Admitting: *Deleted

## 2019-06-06 NOTE — Telephone Encounter (Signed)
A sympathy card has been started and will be mailed to wife and family.

## 2019-06-06 NOTE — Telephone Encounter (Signed)
Sounds good  I spoke with wife

## 2019-06-06 NOTE — Telephone Encounter (Signed)
Copied from [place] [phone]. Topic: Quick Communication - See Telephone Encounter >> Jun 05, 2019  4:52 PM Loma [place] wrote: CRM for notification. See Telephone encounter for: 06/05/19. Pt wife wanting  to let Dr B know pt passed away on 06/30/2019

## 2019-06-06 NOTE — Telephone Encounter (Signed)
Copied from [place] [phone]. Topic: Quick Communication - See Telephone Encounter >> Jun 05, 2019  4:52 PM Loma [place] wrote: CRM for notification. See Telephone encounter for: 06/05/19. Pt wife wanting  to let Dr B know pt passed away on 2019/06/20

## 2019-06-06 NOTE — Telephone Encounter (Signed)
I will get a card started.

## 2019-06-15 DEATH — deceased

## 2020-09-03 IMAGING — CT CT HEAD WITHOUT CONTRAST
3 series · 16 of 47 positions shown, 19 images · non-contrast
Comparison: None.

CLINICAL DATA: Weakness for 2 days, near syncopal episode today

EXAM:
CT HEAD WITHOUT CONTRAST
TECHNIQUE: Contiguous axial images were obtained from the base of the skull
through the vertex without intravenous contrast.

[Series 2: head wo · axial · 0.47mm/px · z∈[+1063,+1193]mm · 10 of 32 slices shown, 13 images]
[im 3/32  brain]
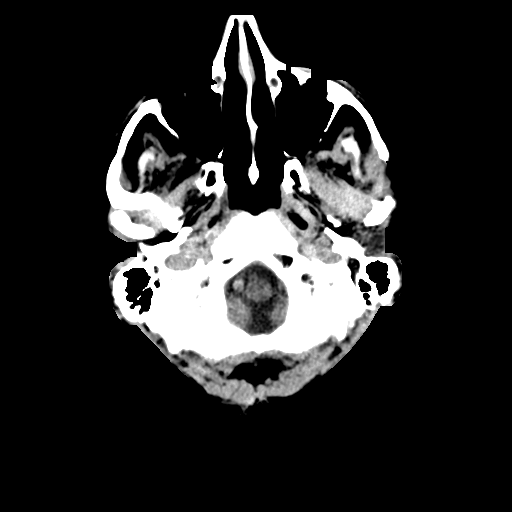
[im 3/32  bone]
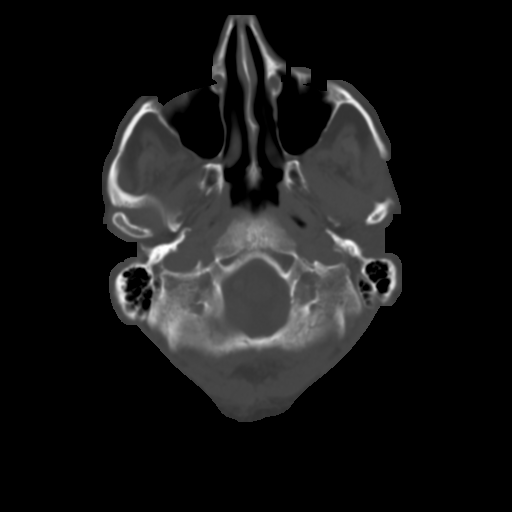
[im 6/32  brain]
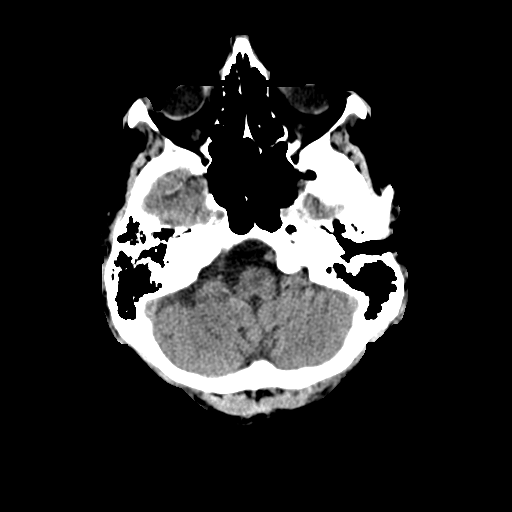
[im 9/32  brain]
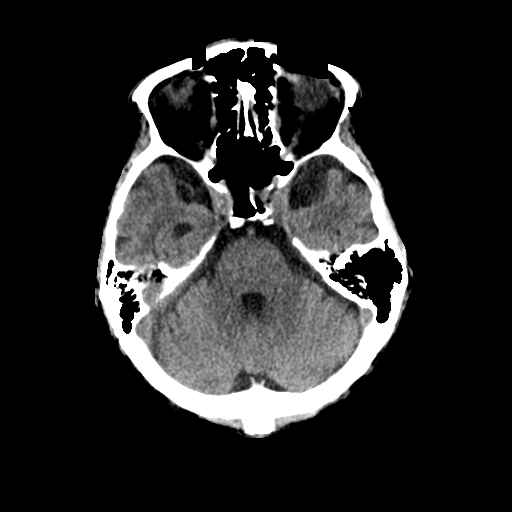
[im 11/32  brain]
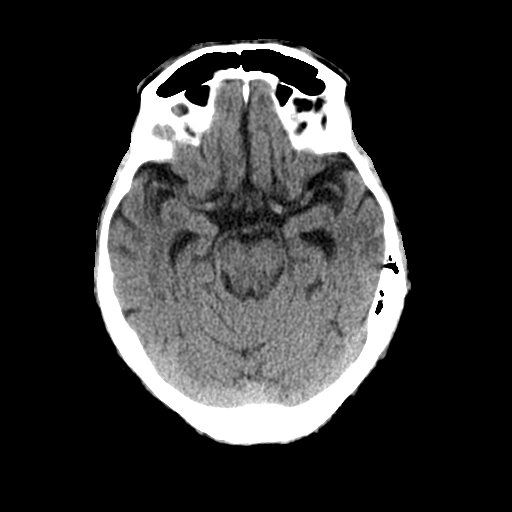
[im 14/32  brain]
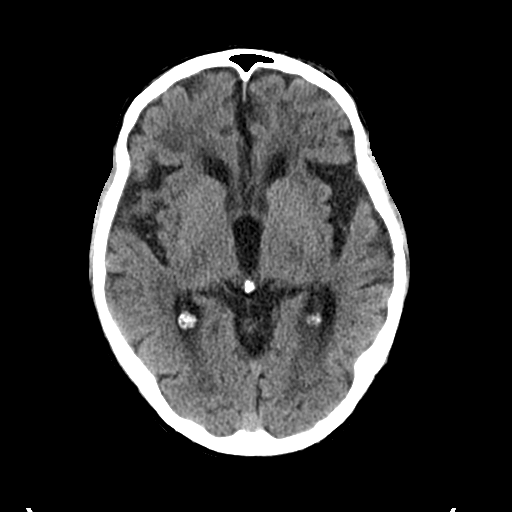
[im 14/32  bone]
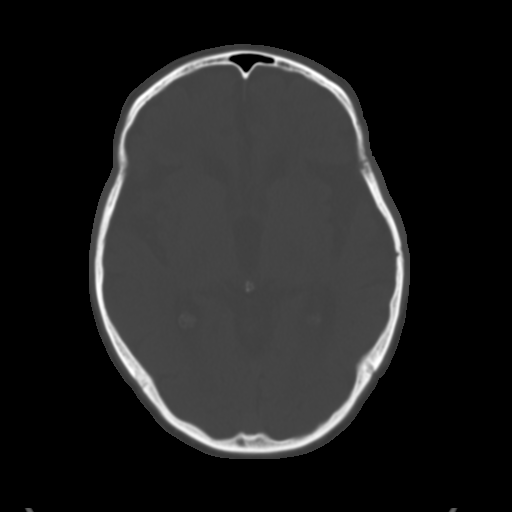
[im 18/32  brain]
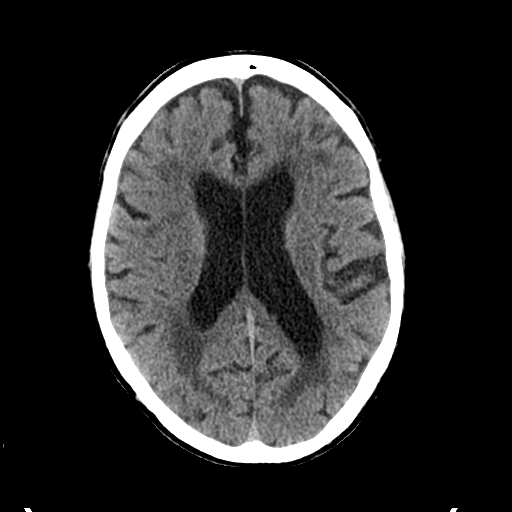
[im 21/32  brain]
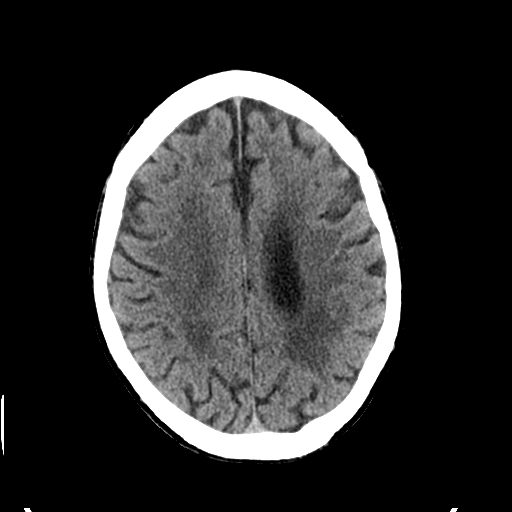
[im 24/32  brain]
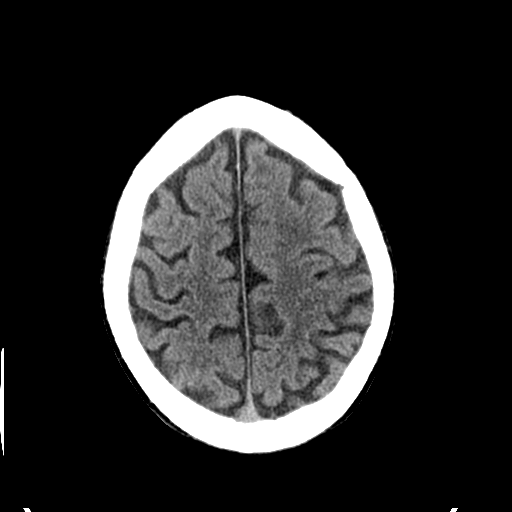
[im 26/32  brain]
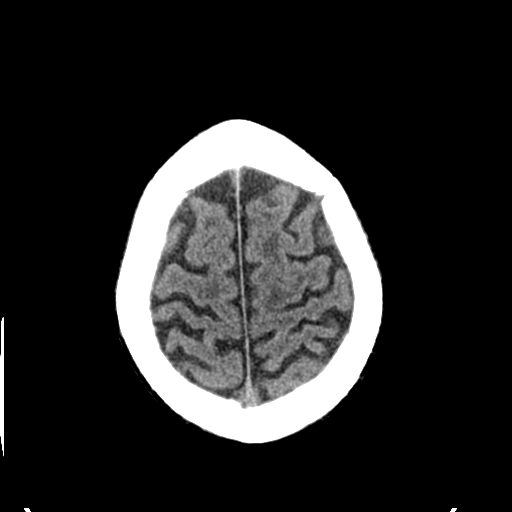
[im 26/32  bone]
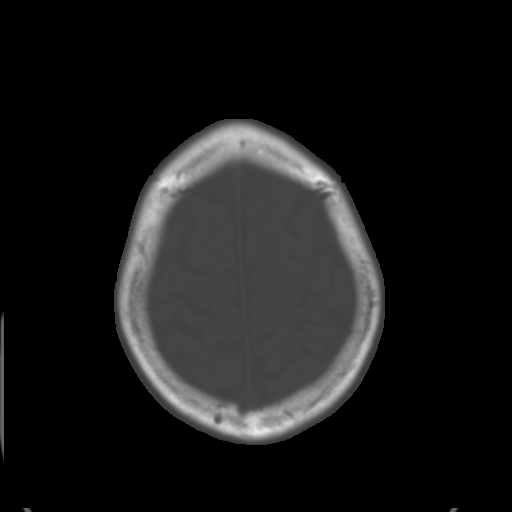
[im 29/32  brain]
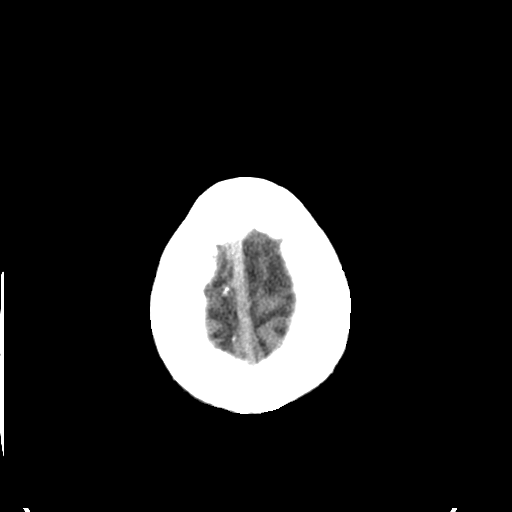

[Series 4: coronal soft tissue · coronal · 0.30mm/px · 3 of 69 slices shown]
[im 23/69  brain]
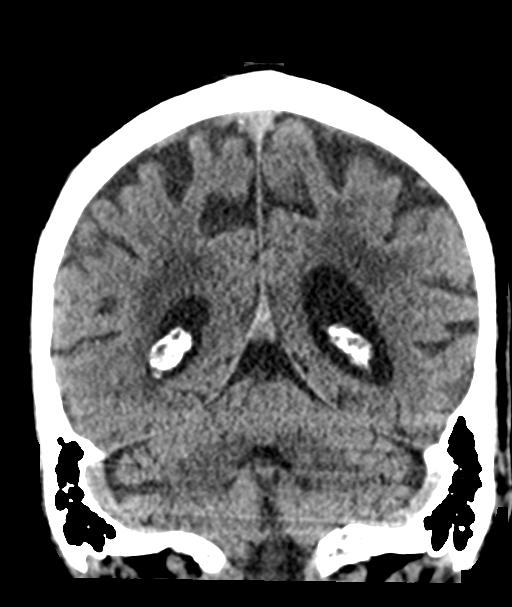
[im 31/69  brain]
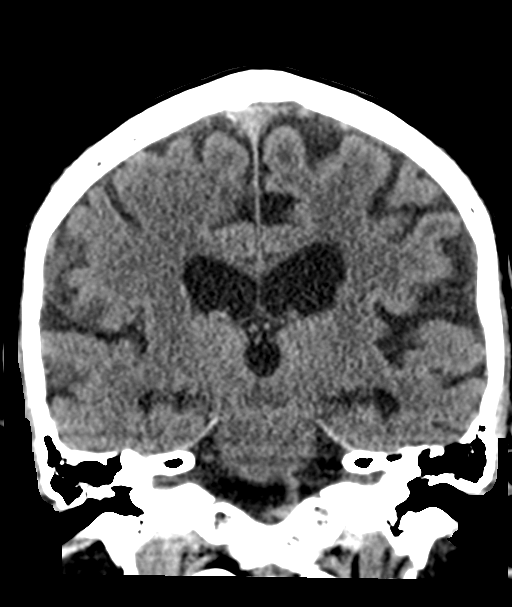
[im 38/69  brain]
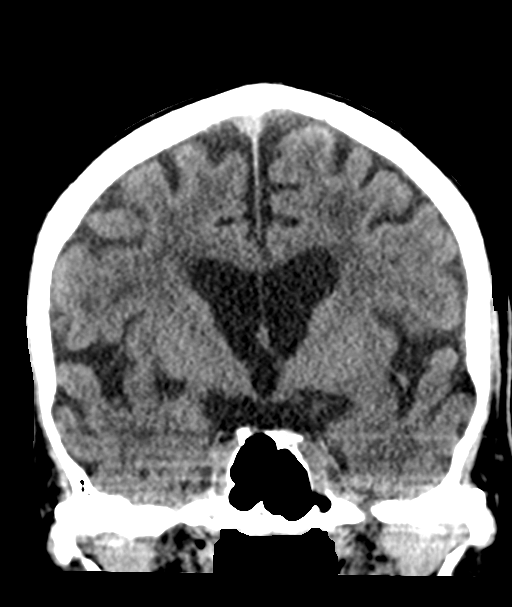

[Series 5: sagittal soft tissue · sagittal · 0.36mm/px · 3 of 53 slices shown]
[im 18/53  brain]
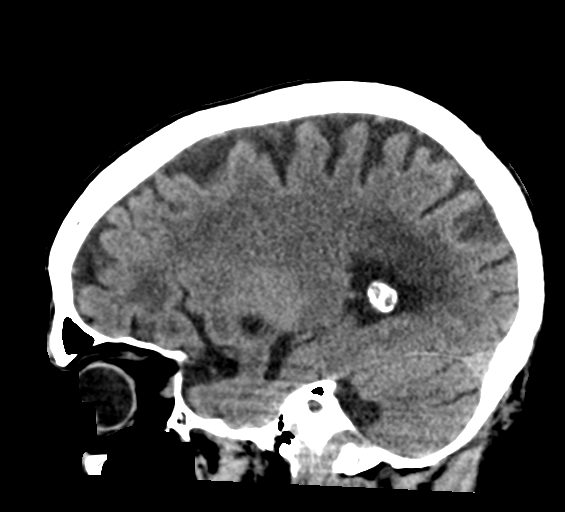
[im 27/53  brain]
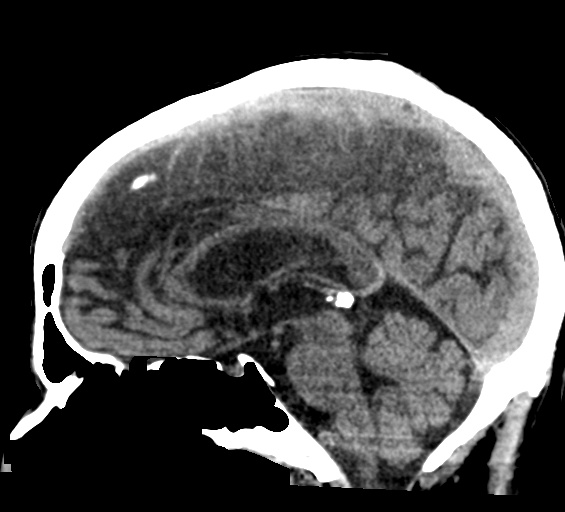
[im 35/53  brain]
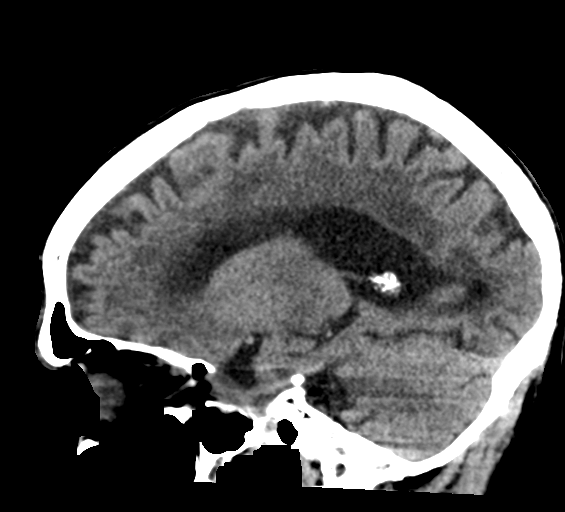

[16 of 47 positions shown; findings below may reference images not displayed]

FINDINGS: Brain: Mild atrophic and chronic white matter ischemic changes are
noted. No findings to suggest acute hemorrhage, acute infarction or
space-occupying mass lesion are noted.

Vascular: No hyperdense vessel or unexpected calcification.

Skull: Normal. Negative for fracture or focal lesion.

Sinuses/Orbits: No acute finding.

Other: None
IMPRESSION: Chronic atrophic and ischemic changes without acute abnormality.
